# Patient Record
Sex: Female | Born: 1985 | Race: Black or African American
Health system: Northeastern US, Academic
[De-identification: ages and names within clinical notes are randomized; demographics above are authoritative.]

## PROBLEM LIST (undated history)

## (undated) DIAGNOSIS — G43909 Migraine, unspecified, not intractable, without status migrainosus: Secondary | ICD-10-CM

## (undated) DIAGNOSIS — D649 Anemia, unspecified: Secondary | ICD-10-CM

## (undated) DIAGNOSIS — M419 Scoliosis, unspecified: Secondary | ICD-10-CM

## (undated) DIAGNOSIS — E559 Vitamin D deficiency, unspecified: Secondary | ICD-10-CM

## (undated) DIAGNOSIS — IMO0002 Reserved for concepts with insufficient information to code with codable children: Secondary | ICD-10-CM

## (undated) HISTORY — DX: Anemia, unspecified: D64.9

## (undated) HISTORY — DX: Reserved for concepts with insufficient information to code with codable children: IMO0002

## (undated) HISTORY — DX: Vitamin D deficiency, unspecified: E55.9

## (undated) HISTORY — DX: Migraine, unspecified, not intractable, without status migrainosus: G43.909

## (undated) HISTORY — DX: Scoliosis, unspecified: M41.9

---

## 2012-03-01 IMAGING — US OB
1 series · 13 of 16 positions shown · non-contrast
Comparison: none

Final Report

Ultrasound first trimester.
Clinical statement: Pregnancy and spotting.
TECHNIQUE: Transabdominal and transvaginal imaging of the pelvis were 
performed.
No prior ultrasound is submitted for comparison.

[Series 1: ob · 0.22mm/px · 13 of 41 slices shown]
[im 1/41]
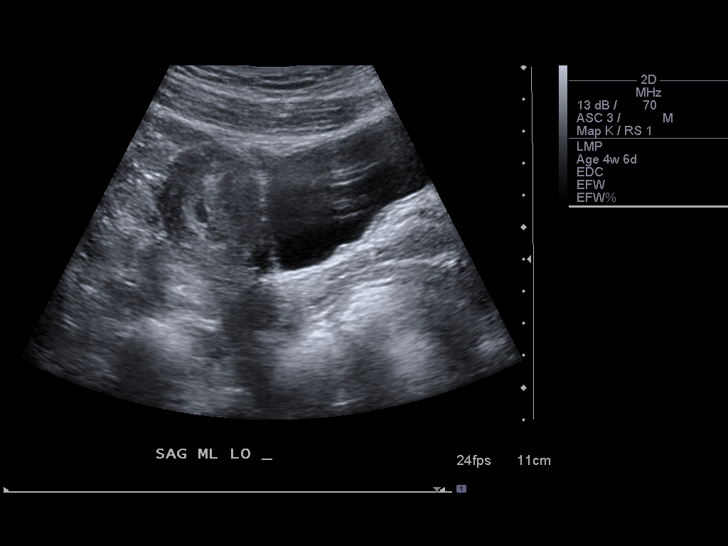
[im 3/41]
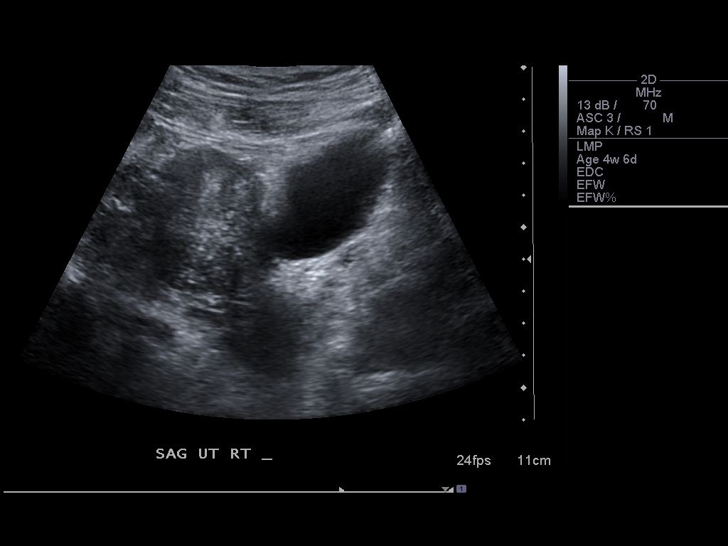
[im 9/41]
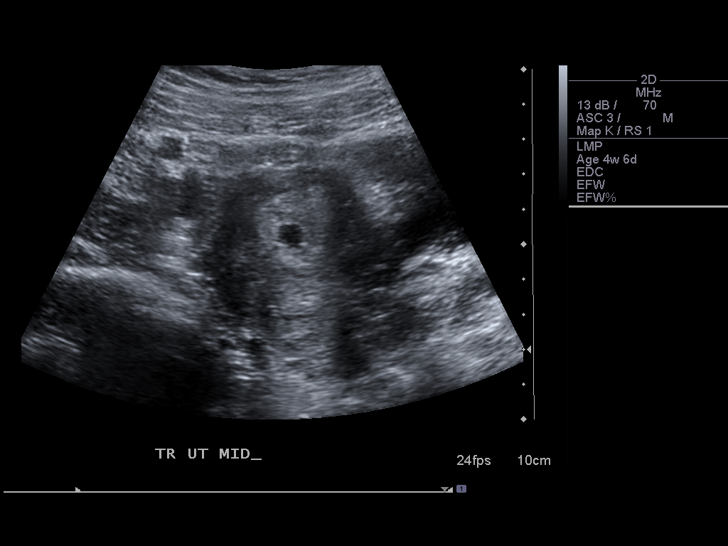
[im 11/41]
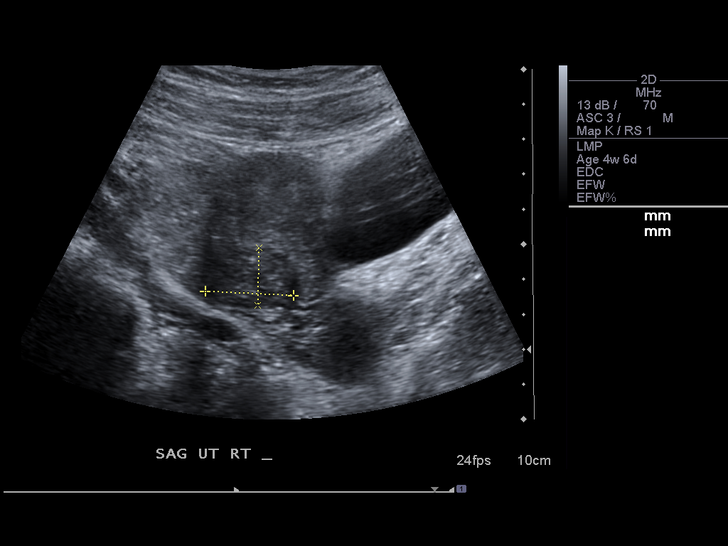
[im 14/41]
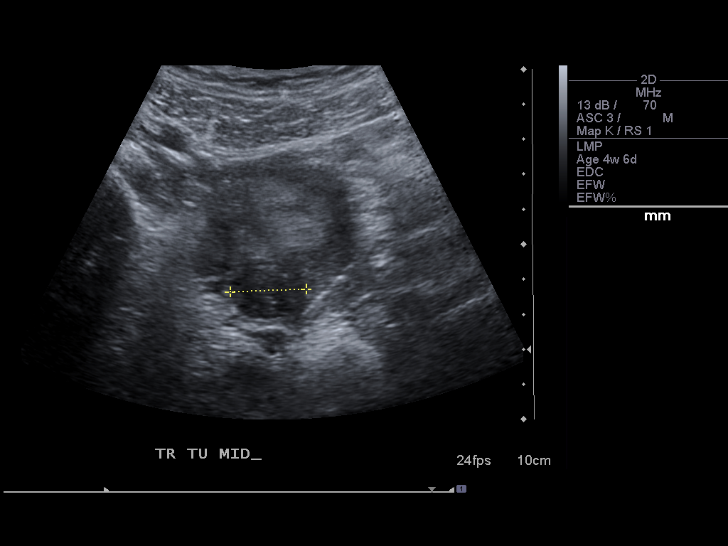
[im 17/41]
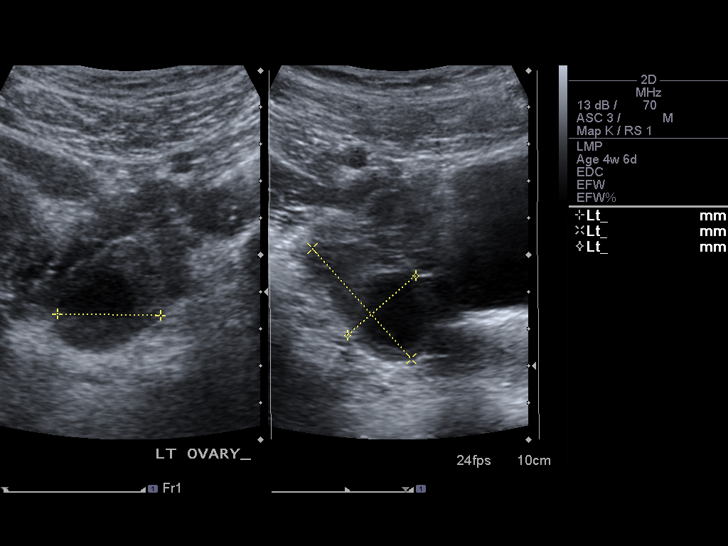
[im 22/41]
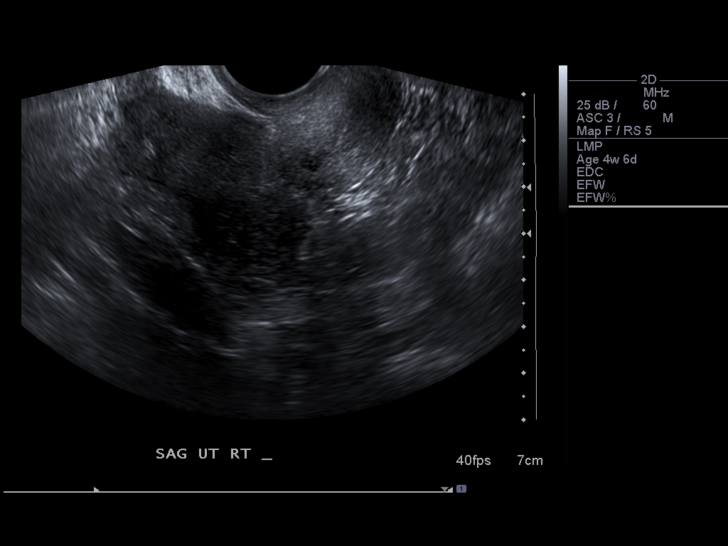
[im 25/41]
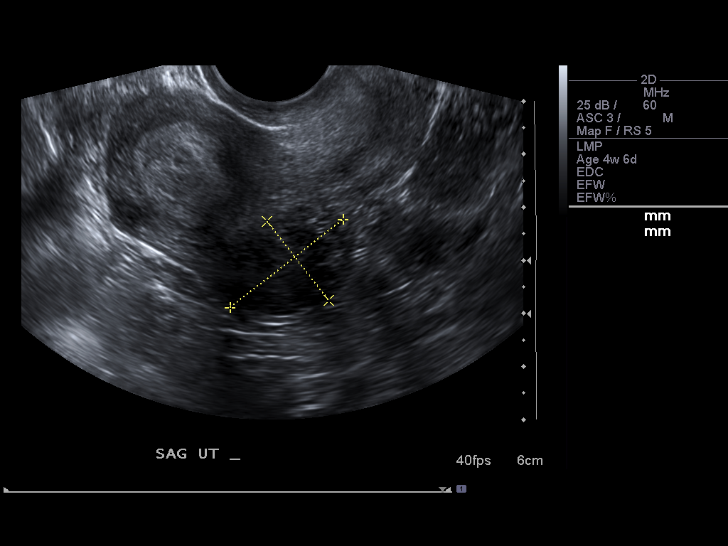
[im 27/41]
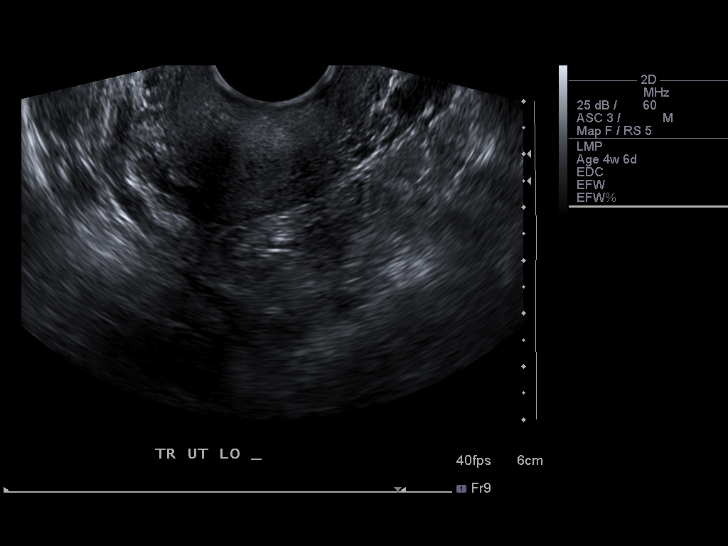
[im 30/41]
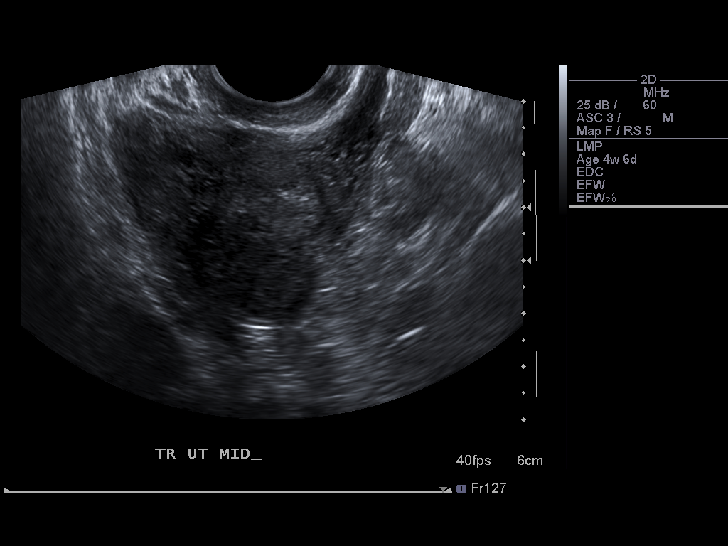
[im 33/41]
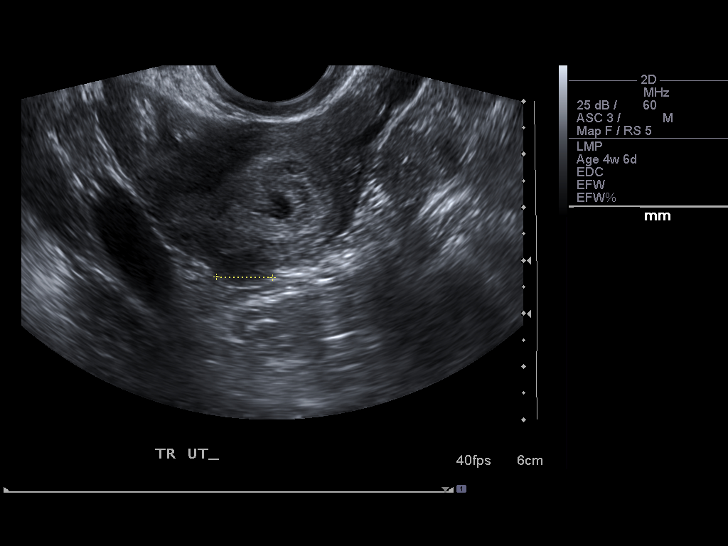
[im 38/41]
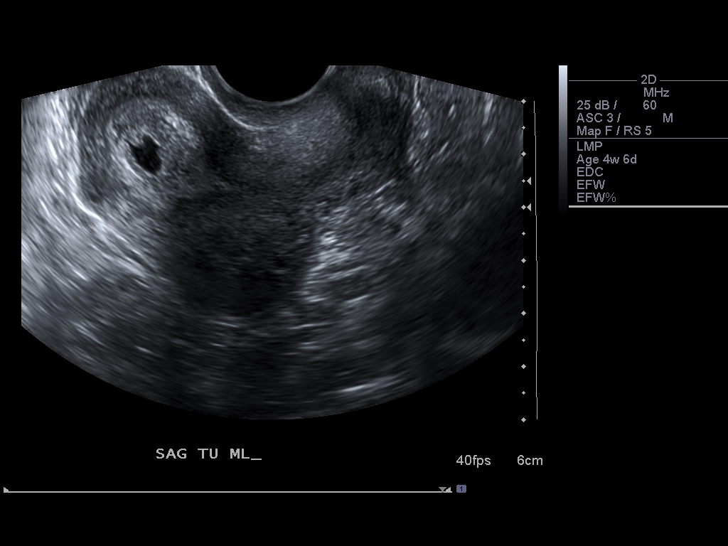
[im 41/41]
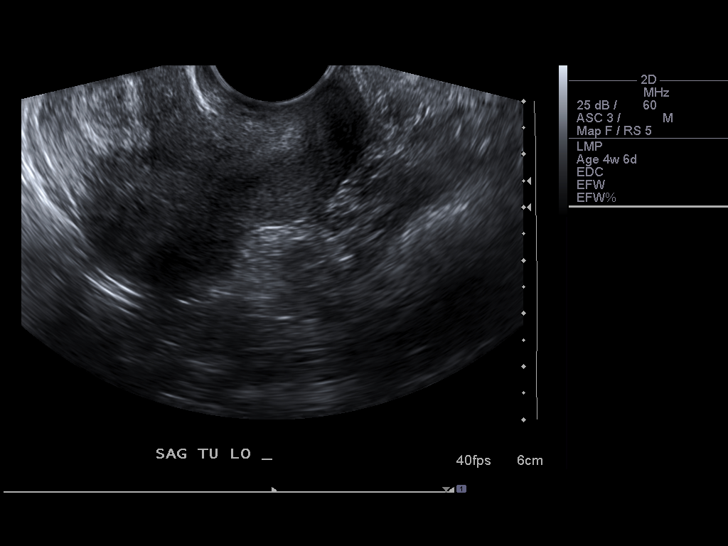

[13 of 16 positions shown; findings below may reference images not displayed]

FINDINGS: Uterus measures approximately 6.3 x 3.0 x 4.3 cm.  

Previously visualized small posterior uterine body fibroid noted 

measuring 2.7 x 2.7 x 1.1 cm.  There is a new large exophytic 

posterior mid uterine body fibroid measuring 2.7 x 1.9 x 2.4 cm.

A small saclike collection is noted in the endometrium with 

suggestion of decidual reaction likely representing an early 

intrauterine gestational sac measuring 0.6 x 0.6 x 0.7 cm with a mean 

sac diameter of 0.6 cm, corresponding to 5 weeks and 2 days.  No 

evidence of fetal pole or yolk sac is identified.

Left ovary measures 2.3 x 2.3 x 2.6 cm.  Left ovarian corpus luteal 

cyst measures 2.4 x 1.4 x 1.6 cm.  Right ovary measures 2.8 x 1.6 x 

1.8 cm.  Bilateral ovarian color Doppler flow are noted.
IMPRESSION: 1. Small sac like collection in the endometrium with suggestion of 

decidual reaction likely representing early intrauterine gestational 

sac.  No evidence of fetal pole or yolk sac.  Estimated gestational 

age based on mean sac diameter corresponds to 5 weeks and 2 days.  

Please correlate with current beta-hCG value.  Followup ultrasound 

and serial beta hCG value is recommended to document for progression.

2.  Left ovarian corpus luteal cyst.

3.  Large posterior exophytic mid uterine body fibroid, new since 

previous ultrasound.  Second small posterior fundal fibroid has not 

significantly changed.  Recommend gynecologic followup.

## 2012-03-20 ENCOUNTER — Telehealth: Payer: Self-pay

## 2012-03-20 NOTE — Telephone Encounter (Signed)
Left VM message to return call to [facility].

## 2012-03-20 NOTE — Telephone Encounter (Signed)
Patient called to start prenatal care.  She is new to the area.  NPI made for 03/23/12.  Pregnancy was confirmed at an ED in [place].  She is having brown spotting, has been off and on for a few weeks.

## 2012-03-21 NOTE — Telephone Encounter (Signed)
Spoke with patient- states " I'm all set- no problems"

## 2012-03-23 ENCOUNTER — Ambulatory Visit: Payer: Self-pay | Admitting: Reproductive Endocrinology and Infertility

## 2012-03-23 ENCOUNTER — Ambulatory Visit: Payer: Self-pay

## 2012-03-23 ENCOUNTER — Other Ambulatory Visit: Payer: Self-pay | Admitting: Reproductive Endocrinology and Infertility

## 2012-03-23 ENCOUNTER — Encounter: Payer: Self-pay | Admitting: Reproductive Endocrinology and Infertility

## 2012-03-23 VITALS — BP 130/68 | Ht 67.0 in | Wt 194.0 lb

## 2012-03-23 DIAGNOSIS — Z3480 Encounter for supervision of other normal pregnancy, unspecified trimester: Secondary | ICD-10-CM

## 2012-03-23 DIAGNOSIS — O4690 Antepartum hemorrhage, unspecified, unspecified trimester: Secondary | ICD-10-CM | POA: Insufficient documentation

## 2012-03-23 DIAGNOSIS — Z3400 Encounter for supervision of normal first pregnancy, unspecified trimester: Secondary | ICD-10-CM | POA: Insufficient documentation

## 2012-03-23 LAB — POCT URINE PREGNANCY
Lot #: 708616
Preg Test,UR POC: POSITIVE — AB

## 2012-03-23 LAB — TYPE AND SCREEN FOR PNP
ABO RH Blood Type: O POS
Antibody Screen: NEGATIVE

## 2012-03-23 MED ORDER — PRENATAL VITAMINS PLUS 27-1 MG PO TABS
1.0000 | ORAL_TABLET | Freq: Every day | ORAL | Status: DC
Start: 2012-03-23 — End: 2012-03-23

## 2012-03-23 MED ORDER — PRENATAL VITAMINS PLUS 27-1 MG PO TABS
1.0000 | ORAL_TABLET | Freq: Every day | ORAL | Status: DC
Start: 2012-03-23 — End: 2013-02-05

## 2012-03-23 NOTE — Progress Notes (Addendum)
Patient ID: Antanisha Berrien is a 26 y.o. female    HPI: Patient seen in the office today for NPI; recently moved to [place] from [place].  Patient's last menstrual period was 01/27/2012.  Patient presents to the office with complaints of vaginal spotting during pregnancy.  Patient reports spotting started about 8 weeks ago when she discovered she was pregnant.  Patient states spotting is brown in color, occurring every 2-3 days.  Patient reports occasional cramping.  Denies new sexual partners or concerns for STDs.  Denies current vaginal bleeding.    Patient's medications, allergies, past medical, surgical, social and family histories were reviewed and updated as appropriate.    Review of Systems    CONSTITUTIONAL: Appetite good, no fevers, night sweats or weight loss  CV: No chest pain, shortness of breath or peripheral edema  RESPIRATORY: No cough, wheezing or dyspnea  GI: No nausea/vomiting, abdominal pain, or change in bowel habits  GU: +Vaginal spotting.  No dysuria, urgency or incontinence  PSYCH: No depression or anxiety    Objective  BP 130/68    Physical Exam  Gen: 26 y.o. female, NAD  Abdominal: Soft, non-tender, no palpable masses, no rebound or guarding  PELVIC: Normal external female genitalia. BUS WNL.   VAGINA: pink, clear of discharge. No heme appreciated    CERVIX: without obvious lesions, no discharge, closed   UTERUS:  NSSC and non-tender.    ADNEXA: non-tender, negative for masses.        Assessment:    26 y.o. female with reported vaginal spotting in early pregnancy.         Plan:    No evidence of VB on exam today.  Pregnancy warning symptoms and ectopic precautions were reviewed.  USN for viability ordered.   Prenatal labs ordered.  Cervical DNA amplification for GC and Chlamydia and a vaginitis screen were collected.  Will follow up with patient for any abnormal results.   Bleeding precautions were reviewed.  RTO in 2 weeks for NOB or PRN

## 2012-03-23 NOTE — Patient Instructions (Signed)
Common Discomforts:  FIRST TRIMESTER    Pregnancy is an exciting time in your life.  Even though you may have planned to have this baby, your body's changes may not be something you planned on.  Here are some of the more common discomforts that women have during pregnancy.  Talk to your health care provider (HCP) if these tips do not seem to help or if you have other concerns.    Nausea and Morning Sickness  Nausea is the most common complaint of early pregnancy.  It is usually caused by hormonal changes.  Things that help with nausea include the following:  " Do not let your stomach get completely empty.  Eat small, frequent meals.   " Keep crackers or dry toast at your bedside and eat before getting up.  " Eat a small snack before going to bed at night.  " Do not drink large amounts of liquids at one time.  " Avoid foods that are greasy, spicy or have odors that bother you.  " Take your prenatal vitamin at night if taking it in the morning upsets your stomach.    Fatigue  Feeling tired is common in early pregnancy due to the increase needs of your body.  Your amount of sleep needs to increase by several hours.   It is important to take naps and go to bed earlier.  Even just putting your feet up for a few minutes every hour or so helps.    Heartburn  Heartburn is a burning feeling in your chest.  During early pregnancy, stomach acid can back up into your esophagus due to a change in your hormone levels.  Things you can do to help relieve heartburn are:  " Avoid eating large meals and greasy, spicy, fatty foods.  " Avoid chewing gum.  " Do not lie down, bend over or stoop for 2 hours after eating.   " Raise the head of your bed 6 inches. You can use a couple of blocks of wood to do this.   " Avoid tight clothing around your abdomen or waist.  " Check with your HCP about an antacid that is safe to use.     Constipation  Constipation (going too long without having a bowel movement) happens because your hormone levels  slow down the normal activity of your gastrointestinal (GI) tract, especially your intestines.  Things that help include:  " Increasing the "roughage" or fiber that you eat.  Eat more fruit, vegetables, dried fruits and bran or whole grain foods.  " Drinking more fluids-at least eight tall glasses of liquids each day.  Water is the best, but any liquids will help.  " Increasing your activity. Even taking an extra 15 to 30 minute walk each day can help.  " Maintaining regular bowel habits.  Try to have a bowel movement at the same time every day, but do not strain (push really hard) to get it out.  Sometimes a hot drink helps.  " Checking with your HCP about using a stool softener:  these medications make your bowel movement easier to get out.   Do no use laxative or enemas unless your HCP has ordered a special kind for you,    Frequent Urination  During early pregnancy you will notice that you have to go to the bathroom to urinate more often.  This is due to hormones in your body and to the growing uterus pushing on your bladder.  This frequency is   normal but tell your HCP if you notice any burning or pain when you urinate.     Breast Tenderness  Your breasts get larger during pregnancy as the milk glands develop.  Sometime you feel a tingling or throbbing sensation.  These changes are normal.   If you wear a bra, make sure it supports you without being too tight.     Vaginal Discharge  The change in your hormone levels makes a change in the cells in your vagina.   This causes an increase in vaginal secretions.  The discharge  is usually thin and clear or white in color.  It should not have any odor.  Things you can do to help yourself feel more comfortable include the following:  " Keep your vulva (private area) clean and dry.  " Do not wear tight clothing or too many layers of clothing.   " Wear cotton underwear  " Do not douche or use feminine hygiene products or strong soaps.  " Report irritation, odor or a  colored discharge to your HCP.    Warning Signs  Most women do not have problems during their pregnancy; however, sometimes a problem can occur.  Be sure to call your health care provider (HCP) right away so that any possible treatment can be started.  Warning signs in the first trimester include:  " Leakage of fluid from the vagina  " Bleeding from the vagina, with or without passing any blood clots or tissue.  " Fever of more than 100.4 degrees F or 38.0 degrees C  " Abdominal or pelvic (area between your hips) pain   " Severe back pain that does not go away with rest  " Nausea and vomiting that lasts more than 1 day.  " Tea-colored urine or not urinating often enough  " Severe headache  " Fainting or blackouts    If you have any of these symptoms, call our office right away.     Reviewed 04/2010      Food Safety in Pregnancy    What you need to know:  Not all foods are safe for pregnant women.  Some contain high levels of chemicals that can affect your baby's development.  Others put you at risk for getting an infection that can hurt your baby.    What you can do:  Use common sense when preparing and selecting food.  Avoid the following:  " Swordfish, shark, king mackerel and tile fish.  These fish can contain potentially risky levels of mercury.  Mercury can be transferred to the growing fetus and cause serious health problems.  Also avoid game fish until you check its safety with your local health department.  (A game fish is any fish caught for sport, such as a trout, salmon or bass).  " Raw fish, especially shellfish (oysters or clams)  " Undercooked meat, poultry and seafood.  Cook all of them thoroughly to kill bacteria.  Do not eat hotdogs or luncheon meats.  Examples are deli meats such as ham, turkey salami and bologna.  If you do eat these foods reheat them until steaming hot.   " Refrigerated pates or meat spreads.   Canned versions are safe.   " Refrigerated smoked seafood unless it has been cooked  (as in a casserole).  Canned versions are safe.  " Soft-scrambled and "over-easy" eggs and all foods made with raw or lightly cooked eggs.  " Soft cheeses made with unpasteurized milk.  Examples are Brie,   feta, Camembert, Roquefort, blue-veined, queso blanco, queso fesco and panela.  Check the label to see what kind of milk was used to make the cheese.    " Unpasteurized milk and any foods made from it.  " Unpasteurized juices  " Raw vegetable sprouts, including alfalfa, clover, radish and mung bean  " Herbal supplements and teas  Also do not eat too much liver.  It contains high amounts of Vitamin A, which can lead to birth defects.    Some studies indicate that your baby may be at increased risk of developing a food allergy in later life if you, your partner or a family member has a food allergy.  You may wish to consult a food allergy specialist for help in planning your diet during pregnancy and breastfeeding.     For more information, visit the web-site Food Safety for Moms-to-Be http://www.fda.gov/food/resourcesforyou/healtheducators/ucm081785.htm  Reviewed 04/2010      Approved Medication for the Pregnant Women:    Analgesia (Pain Relief )                    Tylenol  Flexiril    Antibiotics:  Amoxicillin  Penicillin  Macrobid  Erythromycin  Keflex    Asthma  Isoproterenol, Proventil, Beconase  Ventolin, Cromolyn    Cold Remedies  Sudafed (in 2nd and 3rd trimester use only)  Robitussin (guaifenesin) (?not 1st tri)  Vicks Vapor rub  Benadryl  Throat Lozenges (any brand)  Afrin Nasal Spray (3 day limit)    Allergies:  Zyrtec  Loratadine    Constipation:  Milk of Magnesia  Metamucil  Colace (docusate sodium)  Fiber laxatives    Diarrhea:  Kaopectate  Imodium (not 1st tri)   Hemorrhoids  Proctofoam  Preparation H  Anusol  Tucks    Indigestion  Tums  Maalox  Rolaids  Mylanta  Riopan plus simethicone  Pepcid  Zantac  Tagamet    Miscellaneous  Sulfacetamide eye drops   Hydrocortizone cream 0.5  %  Novocaine-lidocaine  (with epinephrine, if necessary)    Nausea & Vomiting  Emetrol  Gatorade  Reglan  Compazine  Phenergan  Zofran ODT  Dramamine  Meclizine  Vitamin B6, 25 mg. tablets: ½ tablet 3 times a day.  Peppermint and ginger tea  Peppermint    UTI:  Keflex  Erythromycin  Macrobid  Augmentin

## 2012-03-23 NOTE — Progress Notes (Signed)
Barriers to education/learning assessment    Person assessed: patient    Factors that affect learning:   Physical: wears glasses  Emotional: None  Misc:None    Patient has support system for learning:yes  spouse / significant other    Ability/readiness to learn (ability to grasp concepts, respond to questions, follow directions)  Comprehension: Good   Motivation: Ask questions and Eager to learn  Preferred learning method: Discussion, Hearing and Visualization      Educational background  Some College    Assessment by: Danford Bad    Nutritional Risk Assessment      Obesity (>30 BMI) There is no height or weight on file to calculate BMI.  Body mass index is 30.38 kg/(m^2).       Patient requests nutritional consultation no          Functional Screen:    Any difficulties with: getting out of a chair, climbing stairs, walking, bathing, dressing, toileting, cooking, feeding yourself, work, other daily activities: no (if yes, specify details)    Infection History:  - History of STD's: no  - History of Chicken pox: yes  - Has patient be vaccinated: N/A  - Does the patient own a cat? no  - if yes, was the patient educated? yes  - History of TB or positive PPD? no    Social History:  - Do you have any history of domestic violence? no  - Do you feel unsafe with your partner? no  - Do you have any issues with transportation, food, housing, financial assistance, childcare, clothing, baby supplies? Insurance needs  - Do you feel you need to see social work? no        Helen Morrison is a 26 y.o. G2P0010. LMP was 01/27/2012 She is at [redacted]w[redacted]d   gestation. Patient reports breast tenderness and vaginal spotting.    Social History:  Patient is currently employed as a Engineering geologist. Patient has a stable home environment. Father of the baby is is involved with the pregnancy.Domestic violence:No.  Social work referral was not made.    Medical / Surgical history:   Past Medical History   Diagnosis Date   . Abnormal Pap smear     . Anemia    . Migraines      Allergies as of 03/23/2012 - Up to Date 03/23/2012   Allergen Reaction Noted   . Pcn (penicillins) Swelling 03/23/2012       Ob /Gyn History: The patient has an STD history of none and her HIV status is unknown.    OB History    Grav Para Term Preterm Abortions TAB SAB Ect Mult Living    2    1  1                Plan:  New OB appointment was scheduled. Referrals made to [facility] and financial aid. The patient consented to testing for HIV testing and CDS. The patient declined testing for nothing. The patient was sent for no tests. First trimester teaching was done. The patient verbally indicated she understands the teaching and plan.  New pt,just moved here from [facility].States was seen in an ED in [place] twice in August for c/o vaginal spotting,pt unable to locate records to bring today.Medical release has been signed,will be given to JKromm.Pt reports last episode of spotting was on 03/14/2012,seen at an Urgent Care in [place] on 09.03.13 for this complaint States has had BHCG levels done, U/S in [place] that was "too early".Pt is  unsure of her blood type.No pain today.  Urgent appt has been scheduled for this afternoon with [facility],as a f/u on vaginal spotting and indeterminate U/S.  Pt also has met with the [facility] today.    HIV pretest counseling for less than 8 minutes.    Helen Morrison 03/23/2012

## 2012-03-24 ENCOUNTER — Telehealth: Payer: Self-pay

## 2012-03-24 LAB — HIV 1&2 ANTIGEN/ANTIBODY: HIV 1&2 ANTIGEN/ANTIBODY: NONREACTIVE

## 2012-03-24 LAB — CHLAMYDIA PLASMID DNA AMPLIFICATION: Chlamydia Plasmid DNA Amplification: 0

## 2012-03-24 LAB — VAGINITIS SCREEN: DNA PROBE: Vaginitis Screen:DNA Probe: 0

## 2012-03-24 LAB — N. GONORRHOEAE DNA AMPLIFICATION: N. gonorrhoeae DNA Amplification: 0

## 2012-03-24 NOTE — Telephone Encounter (Signed)
Obc/patient is calling to schedule a physical for work/school,can be reached at [phone]

## 2012-03-24 NOTE — Telephone Encounter (Signed)
Spoke to Dr. Geradine Girt in regards to policies for work/school physicals.  We are able to perform them as long as they do not require hearing/eye tests and we are not "equipped" to perform those tests.  Informed pt, stated that she did not see noted on the form the need for eye/hearing tests to be performed.  Pt stated the form to be more immunization driven.  Informed pt to bring form with her and present at time of NOB, pt voiced understanding.  No further questions or concerns.

## 2012-03-27 LAB — PRENATAL PROFILE
Baso # K/uL: 0 10*3/uL (ref 0.0–0.1)
Basophil %: 0.2 % (ref 0.1–1.2)
Eos # K/uL: 0.1 10*3/uL (ref 0.0–0.4)
Eosinophil %: 1.4 % (ref 0.7–5.8)
HBV S Ag: NEGATIVE
Hematocrit: 36 % (ref 34–45)
Hemoglobin: 11.6 g/dL (ref 11.2–15.7)
Lymph # K/uL: 1.9 10*3/uL (ref 1.2–3.7)
Lymphocyte %: 31.2 % (ref 19.3–51.7)
MCV: 86 fL (ref 79–95)
Mono # K/uL: 0.7 10*3/uL (ref 0.2–0.9)
Monocyte %: 10.6 % (ref 4.7–12.5)
Neut # K/uL: 3.5 10*3/uL (ref 1.6–6.1)
Platelets: 249 10*3/uL (ref 160–370)
RBC: 4.2 MIL/uL (ref 3.9–5.2)
RDW: 13.3 % (ref 11.7–14.4)
Rubella IgG AB: POSITIVE
Seg Neut %: 56.6 % (ref 34.0–71.1)
Syphilis Screen: NEGATIVE
Syphilis Status: NONREACTIVE
WBC: 6.2 10*3/uL (ref 4.0–10.0)

## 2012-04-04 ENCOUNTER — Other Ambulatory Visit: Payer: Self-pay | Admitting: Gastroenterology

## 2012-04-04 ENCOUNTER — Ambulatory Visit: Payer: Self-pay

## 2012-04-13 ENCOUNTER — Ambulatory Visit: Payer: Self-pay | Admitting: Reproductive Endocrinology and Infertility

## 2012-04-13 ENCOUNTER — Encounter: Payer: Self-pay | Admitting: Reproductive Endocrinology and Infertility

## 2012-04-13 VITALS — BP 126/59 | Ht 67.0 in | Wt 192.6 lb

## 2012-04-13 DIAGNOSIS — O4690 Antepartum hemorrhage, unspecified, unspecified trimester: Secondary | ICD-10-CM

## 2012-04-13 DIAGNOSIS — Z3400 Encounter for supervision of normal first pregnancy, unspecified trimester: Secondary | ICD-10-CM

## 2012-04-13 DIAGNOSIS — Z8669 Personal history of other diseases of the nervous system and sense organs: Secondary | ICD-10-CM

## 2012-04-13 LAB — POCT URINALYSIS DIPSTICK
Bilirubin,Ur: NEGATIVE
Blood,UA POCT: NEGATIVE
Glucose,UA POCT: NORMAL
Leuk Esterase,UA POCT: NEGATIVE
Lot #: 22041202
Nitrite,UA POCT: NEGATIVE
PH,UA POCT: 6 (ref 5–8)
Specific gravity,UA POCT: 1.015 (ref 1.002–1.03)
Urobilinogen,UA: NORMAL

## 2012-04-13 LAB — DRUG SCREEN CHEMICAL DEPENDENCY, URINE
Amphetamine,UR: NEGATIVE
Benzodiazepinen,UR: NEGATIVE
Cocaine/Metab,UR: NEGATIVE
Opiates,UR: NEGATIVE
THC Metabolite,UR: NEGATIVE

## 2012-04-13 NOTE — Patient Instructions (Signed)
wOCommon Discomforts:  SECOND TRIMESTER    You have made it through the first third of your pregnancy! The middle 3 months--the second trimester--are usually the easiest. Although some women still have nausea and feel especially tired for their whole pregnancy, most women are feeling much better.   Your growing baby and uterus can cause some discomforts, though.  Here are 4 common ones and ways to deal with them:    Back Pain  Lower back pain can be caused by muscle strain (from stretching, bending or lifting).   Sometimes your hormones change and leave you more at risk for back pain. Weak muscles also strain and hurt more easily.  Here are some things that can lesson you discomfort:  • Use proper body mechanics by lifting with your back straight and your knees bent.  Do not twist while lifting or pulling.  Stand and sit with your back as straight as possible.   • Avoid standing too long in one spot. If you can, rest one foot on a footstool or step while you are standing.  Change which foot you lean on every few minutes.   • Wear a special support girdle for pregnant women.   You can find these at maternity shops or a medical supply store.   • Use a footstool when you have to sit for long periods, so your legs are no dangling.  • Ask your partner or a friend for a back rub, or use a heating pad on low setting to relieve aches. Sometimes a warm, not hot, bath feels good too.  • Try to sleep on a firm, supportive mattress. Put a board under your mattress if it is too soft.   Use lost of pillows!  Wedge one under your belly when you are on your side and use one between your knees while on your side too.  Avoid lying flat on your back.  Lean into a pillow placed under your side instead.  • Do pelvic tilt exercises.  On your hands and knees like an angry cat.  Rock forward and backward a little to stretch out your back.   On your back, bend your knees, then lift your bottom up in the air so only your head, shoulders  and feet touch the floor.  Hold this position for a few seconds, then relax and do it again.   • Wear good walking shoes as much as possible.  Try not to wear shoes with a heel higher than 2 inches.  If your backache “comes and goes” in a regular pattern or if you have leakage or bleeding from your vagina, call your health care provider right away.    Hemorrhoids  Hemorrhoids are also called piles.  They are extra-large blood vessels in your rectum (where you have bowel movements) that itch and sometimes bleed.  They are caused    by the increased pressure of your baby in your pelvis (area between your hips).  The pressure of the baby blocks off the blood vessels and causes them to get too big.  Things that help keep hemorrhoids from happening to getting too bad include the following:  • Avoid constipation.  Eat lots of fruits and vegetables so you have a bowel movement every day if possible.  Ask your health care provider if you need a laxative to help you have regular bowel movements.  • Do not strain or push when having a bowel movement.  Breathe slowly, relax and let   it come out without pushing.  • Drink more fluids and eat foods high in fiber, like whole grains.  • Do not have anal sex (in which the man puts his penis in your anus, where bowel movements come out).  • Take stiz baths with cool water 2-3 times a day.  A sitz bath is a little tub that fits on your toilet and lets you soak your bottom without getting the rest of your body wet. You can buy a stiz bath kit at a drugstore or medical supply store.   • Use ice packs or witch hazel pads (like Tucks) to take the swelling down.   • Do Kegel exercises.  A Kegel exercise is when you tighten the muscles in your bottom. Next time you urinate, try to stop the urine.  The muscle tightening you do to stop your urine is a Kegel exercise.  After you figure out how, only do Kegel exercises when you are not urinating. Starting and stopping your urine flow too much  can lead to infections or bladder problems.  • Try to sit with your legs crossed tailor style (both feet tucked under the opposite leg, with the knees sticking out) or sit on hard chairs.  Do not sit too long in any chair.  • I you notice bleeding when you go to the bathroom, talk with your health care provider.     Leg Cramps  Leg cramps happen in pregnancy, but it is unclear why.  Things that can help prevent or lessen them include the following:  • Get more calcium in your diet.  Se the guide on Nutrition during Pregnancy for some ways of getting calcium in your diet. In addition to dairy products, there is calcium-fortified orange juice.  Ask your health care provider if you can take a calcium supplement (like Tums).   • Exercise.  Even walking 30 minutes a day can help.  And you can walk for 10 minutes 3 times a day for the same benefit.   • If you get a cramp, try straightening your leg.  Sometimes pointing your toe toward your knee helps.  Stand a foot or so from a wall and lean toward it keeping your knees straight and your feet flat on the floor.  • Massage or apply mild heat with a warm wash cloth or heating pad on low to the cramping area.  If you feel a hard knot or hot spot on your leg, especially in the calf (back of the lower leg that does not go away, call you health care provider right away.    Varicose Veins  Varicose veins show up as blue line under the skin usually on your lower legs.  Sometimes they are thick like cords.  They can be due to your hormones, to pressure on your pelvis from the baby, or to our family tendency (someone else in your family had them).  Things you can do to help present varicose veins includes the following:  • Avoid constrictive (tight) clothing, especially if it is tight in your waist or hip areas.  Also avoid knee socks or hose with elastic bands at the top.  • Try not to sit or stand for long periods without moving around.   • Use good posture and good body  mechanics.  (See the section of this guide on back pain).    If you get varicose veins, these things can help keep them from bothering you or getting   worse:  • Wear good support hose.  You can buy support hose at the maternity shops or medical supplies stores.   • Raise your legs on a footstool when sitting.  Lie down and put your feet up higher than your head every chance you get.  • Talk with your HCP about other things to keep you comfortable.     Many women never have any of these discomforts during their pregnancy.  Others have a lot of discomforts.  Be sure to tell your Health Care Provider about anything that you find uncomfortable.  Write down your questions as they come up so you will remember to ask them during your next office visit or phone call.      Warning Signs: SECOND TRIMESTER    No one wants something to go wrong during her pregnancy.  It can be really temping to not tell anyone when something happens; you just hope it will go away on its own.  Usually something that worries you is not a big problem but it is important to talk to your health care provider any time you have a worry.  Most of the time, bigger problems can be prevented if you get help early enough.  During your second trimester, there are three warning signs that you should call your Health Care Provider about:    Bleeding  After your twentieth week or fifth month of pregnancy, bleeding can be due to several causes.  There can be problems with the placenta.  Placenta previa is when the placenta forms across the cervix (opening to the uterus): placenta abruption is when the placenta pulls ways from the inside of the uterus.  Sometimes sores or scratches on your cervix or vagina can cause bleeding.  Vaginal infections or polyps (see the guide on Sexually Transmitted Diseases) can cause bleeding.  Preterm labor may also cause bleeding.      You may or may not have pain with vaginal bleeding.  It is very important to call your Health  Care Provider or go to the hospital to be examined any time you have vaginal bleeding during your pregnancy.    Pain  Abdominal or pelvic (between your hips) pain during your pregnancy needs to be checked out by your Health Care Provider.       Non-pregnancy causes of pain need to be considered along with pregnancy causes.  Back pain that comes and goes in a pattern or does not go away at all need to be checked out.  Call your Health Care Provider or if the pain is really bad or sudden, go to the hospital where you plan to deliver so you can be examined.     Infection and Fever  When you are pregnant, you can have “normal” colds and infections.  But it is important to tell your Health Care Provider if you have:  • A fever of more than 100.4 degrees F or 38.0 C  • A burning sensation or pain when you urinate or a need to go to the bathroom often  • Green or yellow drainage from your nose, or a tendency to cough up green or yellow phlegm.  • Toothaches, bleeding gums, or sores in the mouth that do not go away.  • Vomiting or diarrhea that last more than an hour or so.  • Any symptom that worries you    If you have a fever, you need to increase the amount of fluids you drink. Fluids help   you flush infection out of your body.  They also help keep you from getting dehydrated (dried out), which is bad for you and the baby. If you have an infection it is also best to get more rest so that your body has the energy to fight the germs.   Antibiotics (medicines that treat infections) are safe during pregnancy and may be necessary to protect your baby from any possible harm from your infection.  Your Health Care Provider will order medicine if you need it.     Remember--you and your Health Care Provider are a team, working to help you have a safe pregnancy and a healthy baby.  Be sure to let your Health Care Provider know of anything that concerns you.  Ignoring or worrying about a problem won’t help it to go away! Getting help  right away can make a big difference in your health and your baby’s safety.

## 2012-04-13 NOTE — Addendum Note (Signed)
Addended by: Jerry Caras on: 04/13/2012 11:15 AM     Modules accepted: Orders

## 2012-04-13 NOTE — Progress Notes (Signed)
Helen Morrison is a 26 y.o. female who presents for New OB physical    HPI:   The patient is a 26 y.o. year old gravida 2, para 0 female at [redacted]w[redacted]d gestation by Ultrasound dating at 8 weeks.  Today she is  doing well.  Denies any complaints of vaginal bleeding or pelvic pain.      OB/GYN History   OB History    Grav Para Term Preterm Abortions TAB SAB Ect Mult Living    2    1  1               GYN History:  Last pap smear NA; denies history of abnormal paps  STI History: none     Menstrual History: regular periods every 28-30 days    PSH: No past surgical history on file.    Family Hx:   Family History   Problem Relation Age of Onset   . Hypertension Mother    . Diabetes Maternal Aunt    . Diabetes Paternal Aunt      .  Personal Hx/Prenatal Risks:  O RH POS  History   Smoking status   . Never Smoker    Smokeless tobacco   . Never Used     ETOH: Denies  Drugs: Denies     ROS: A Comprehensive Review of Systems was negative   O:    Filed Vitals:    04/13/12 1018   BP: 126/59   Height: 5\' 7"  (1.702 m)   Weight: 192 lb 9.6 oz (87.363 kg)     Prenatal Physical Exam    General Exam  HEENT: Normal   Thyroid: Normal   Lymph Nodes: Normal   Neurological: Normal   Skin: Normal   Heart: Normal   Lungs: Normal   Breasts: Normal   Abdomen: Normal   Extremities: Normal     Pelvic  Vulva: Normal   Vagina: Normal   Cervix: Normal   Uterus 11 wks    Adnexa: Normal   Rectum: Normal   Spines: Average   Sacrum: Concave   Subpubic Arch: Normal       Diagonal conjucate    13 cm  Pelvic Type: Gynecoid       A:  26 y.o. G2 P0  female at [redacted]w[redacted]d gestation    P:  Pap smear collected.  Will follow up with patient for any abnormal results.  All prenatal labs were reviewed .  Consented for first trimester screen.  Pregnancy warning symptoms were reviewed.  She was advised to RTC in 4 weeks or on a PRN basis

## 2012-04-13 NOTE — Addendum Note (Signed)
Addended by: Zoila Shutter on: 04/13/2012 12:03 PM     Modules accepted: Orders

## 2012-04-14 LAB — AEROBIC CULTURE: Aerobic Culture: 0

## 2012-04-18 ENCOUNTER — Other Ambulatory Visit: Payer: Self-pay | Admitting: Reproductive Endocrinology and Infertility

## 2012-04-18 ENCOUNTER — Ambulatory Visit: Payer: Self-pay

## 2012-04-18 LAB — GYN CYTOLOGY

## 2012-04-18 NOTE — Progress Notes (Signed)
Patient presents for PPD placement.  Wt. 194lbs, BP 130/78   Allergic to penicillin  PPD placed right forearm  Patient will return in 48hours for PPD read

## 2012-04-20 ENCOUNTER — Ambulatory Visit: Payer: Self-pay

## 2012-04-20 NOTE — Progress Notes (Signed)
Patient here today for PPD read.  PPD was applied to right forearm on 04/18/12.  Result today was neg.  Documentation provided for patient.    VS:  Wt. 193lb.          BP  123/75

## 2012-04-27 ENCOUNTER — Ambulatory Visit: Payer: Self-pay

## 2012-04-27 ENCOUNTER — Other Ambulatory Visit: Payer: Self-pay | Admitting: Gastroenterology

## 2012-04-28 ENCOUNTER — Encounter: Payer: Self-pay | Admitting: Reproductive Endocrinology and Infertility

## 2012-05-09 ENCOUNTER — Encounter: Payer: Self-pay | Admitting: Reproductive Endocrinology and Infertility

## 2012-05-09 ENCOUNTER — Ambulatory Visit: Payer: Self-pay | Admitting: Reproductive Endocrinology and Infertility

## 2012-05-09 VITALS — BP 134/64 | Ht 67.0 in | Wt 190.0 lb

## 2012-05-09 DIAGNOSIS — Z3400 Encounter for supervision of normal first pregnancy, unspecified trimester: Secondary | ICD-10-CM

## 2012-05-09 DIAGNOSIS — N898 Other specified noninflammatory disorders of vagina: Secondary | ICD-10-CM

## 2012-05-09 DIAGNOSIS — B373 Candidiasis of vulva and vagina: Secondary | ICD-10-CM

## 2012-05-09 LAB — POCT VAGINAL WET MOUNT
CLUE CELLS,POC: NEGATIVE
KOH FOR FUNGUS: POSITIVE
TRICHOMONAS, POC: NEGATIVE
WHIFF TEST: NEGATIVE
YEAST,POC: POSITIVE

## 2012-05-09 LAB — POCT URINALYSIS DIPSTICK
Bilirubin,Ur: NEGATIVE
Blood,UA POCT: NEGATIVE
Glucose,UA POCT: NORMAL
Leuk Esterase,UA POCT: 1 — AB
Lot #: 22041202
Nitrite,UA POCT: NEGATIVE
PH,UA POCT: 7 (ref 5–8)
Protein,UA POCT: NEGATIVE mg/dL
Specific gravity,UA POCT: 1.02 (ref 1.002–1.03)
Urobilinogen,UA: NORMAL

## 2012-05-09 MED ORDER — TERCONAZOLE 0.4 % VA CREA *I*
1.0000 | TOPICAL_CREAM | Freq: Every evening | VAGINAL | Status: AC
Start: 2012-05-09 — End: 2012-05-16

## 2012-05-09 NOTE — Progress Notes (Signed)
Helen Morrison is a 26 y.o. female presents for routine Prenatal Visit at [redacted]w[redacted]d     Subjective:  Complains of vaginal irritation and itching for the past week.  Denies vaginal discharge, denies changes to soaps, detergents or hygiene products.  +FM, Denies VB or LOF.      OB History    Grav Para Term Preterm Abortions TAB SAB Ect Mult Living    2    1  1              ROS:   A comprehensive review of systems was negative except for: Genitourinary: positive for vaginal itching  Objective:    Filed Vitals:    05/09/12 1056   BP: 134/64   Height: 5\' 7"  (1.702 m)   Weight: 190 lb (86.183 kg)     Fundal Height: 14 cm  FHT: 157 bpm     Physical Exam  Gen: 26 y.o. female, NAD  PELVIC: Normal external female genitalia. BUS WNL.   VAGINA: erythematous, moderate amount of thick yellow discharge.    CERVIX: visibly closed    Recent Results (from the past 24 hour(s))   POCT URINALYSIS DIPSTICK    Collection Time    05/09/12 11:01 AM       Result Value Range    Specific gravity,UA POCT 1.020  1.002 - 1.03    PH,UA POCT 7.0  5 - 8    Leuk Esterase,UA POCT +1 (*) Negative    Nitrite,UA POCT Negative  Negative    Protein,UA POCT Negative  Negative mg/dL    Glucose,UA POCT Normal  Normal    Ketones,UA POCT ++ moderate (*) Negative    Urobilinogen,UA Normal      Bilirubin,Ur Negative  Negative    Blood,UA POCT Negative  Negative    Exp date 8/14      Lot # [id number]     POCT VAGINAL WET MOUNT    Collection Time    05/09/12 11:16 AM       Result Value Range    TRICHOMONAS, POC Negative-none seen      YEAST,POC Positive moderate      YEAST COMMENT,POC        CLUE CELLS,POC Negative-none seen      LEUKOCYTES,POC        KOH FOR FUNGUS Positive moderate      KOH FOR FUNGUS COMMENT        WHIFF TEST Negative         Assessment:   26 y.o. year old G2 P101 female at [redacted]w[redacted]d weeks gestation; vaginal yeast infection    Plan:    Patient Active Problem List    Diagnosis Date Noted   . Supervision of normal first pregnancy 03/23/2012     Priority:  High     Rh Positive/Rubella immune  -first trimester screen: Unable to obtain nuchal translucency due to fetal position. Recommend second trimester screening  <> QUAD screen  <>Anatomic scan  Refused flu vaccine     . History of migraine headaches 04/13/2012     Was followed by neurology in [place].  Has not establish Neuro care here in [place].  Reports has not had any migraine headaches since her pregnancy began     . Vaginal bleeding in pregnancy 03/23/2012     USN for viability: IUP at 9.5 weks on 10/1  -Type and screen: O+  No further complaints of VB in early pregnancy     Vaginal candidiais: She was given a prescription  for Terconazole 7; one applicator PV QHS x 7.   Pregnancy warning symptoms were reviewed.  RTC in 4 weeks or PRN.

## 2012-05-09 NOTE — Patient Instructions (Signed)
Terazol                                                     [facility]  Terconazole (ter-KON-a-zole) (Vaginal)  Treats vaginal yeast infections. Belongs to a class of drugs called antifungals.   Brand Name(s):Terazol 3 , Zazole , Terazol 7 , Terazol  There may be other brand names for this medicine.  When This Medicine Should Not Be Used:  You should not use this medicine if you have had an allergic reaction to antifungals, such as Gyne-Lotrimin, Monistat, Mycelex, Micatin, Spectazole, or Femstat.   How to Use This Medicine:  Cream, Suppository  Your doctor will tell you how much medicine to use and how often. Wash your hands before and after using this medicine.   This medicine is to be used only in the vagina. Use at bedtime unless your doctor tells you otherwise.   The vaginal cream comes in a tube. You will use an applicator to put the cream into your vagina.   The applicator is an empty plastic tube called a barrel. There is a plunger on one end and an opening on the other end.   Remove the cap from the end of the tube.   Screw the open end of the applicator onto the tube of cream. Pull the plunger out all the way and squeeze the tube until the applicator is full.   Unscrew the applicator from the tube and replace the cap on the tube.   If you are using the vaginal suppositories, unwrap the tablet and place it in the applicator with the pointed side up. You may wet the tablet with warm water or a water soluble lubricating gel (K-Y Jelly). You should not use petroleum jelly (Vaseline).   Gently push the applicator high into the vagina and push the plunger all the way in.   After using, pull the plunger completely out of the applicator and wash both pieces with warm, soapy water. Never use hot or boiling water.   Wear only clean cotton underwear (panties) instead of nylon or rayon underwear. The medicine will come out of your vagina, so wear a minipad or sanitary napkin to protect your  clothing. Keep using this medicine for as long as your doctor tells you to. Continue to use it even if your menstrual period begins. Use pads rather than tampons.  If a dose is missed:  Use your medicine as soon as you remember that you have missed your dose.   If it is nearly time for your next dose, wait until then to use the medicine and skip the missed dose.   You should not use two doses at one time.  How to Store and Dispose of This Medicine:  Keep your medicine at room temperature, away from direct heat or light. Do not freeze.   Keep all medicine away from children.  Drugs and Foods to Avoid:  Ask your doctor or pharmacist before using any other medicine, including over-the-counter medicines, vitamins, and herbal products.  Avoid using other medicines, douches, or vaginal creams unless recommended by your doctor.  Warnings While Using This Medicine:  If you are pregnant, talk to your doctor before using this medicine.   Do not stop using terconazole unless your doctor tells you to. If you stop using the   medicine too soon, your infection may return.   Call your doctor right away if your symptoms do not go away after using this medicine for the full time ordered by your doctor.  Possible Side Effects While Using This Medicine:  If you notice these less serious side effects, talk with your doctor:  Vaginal itching or burning   Increased vaginal discharge   Stomach discomfort   Cramps   Skin rash   Headache  If you notice other side effects that you think are caused by this medicine, tell your doctor.  Call your doctor for medical advice about side effects. You may report side effects to FDA at 1-800-FDA-1088

## 2012-05-09 NOTE — Addendum Note (Signed)
Addended by: Clydie Braun on: 05/09/2012 11:35 AM     Modules accepted: Orders

## 2012-05-10 ENCOUNTER — Encounter: Payer: Self-pay | Admitting: Reproductive Endocrinology and Infertility

## 2012-05-10 LAB — MATERNAL 2ND TRIMESTER QUAD SCR (14-22 6/7 WEEKS)
AFP MoM: 1.62
AFP: 33 IU/mL
Age at Delivery: 28.2 yrs.
DS A Priori Risk: 1:885 {titer}
DS Screen Risk: 1:640 {titer}
HCG MoM: 2.65
HCG: 91015 m[IU]/mL
Inhibin A MoM: 2.12
Inhibin A: 370 pg/mL
OSB Risk: 1:3960 {titer}
Patient Weight: 190 [lb_av]
T18 Screen Risk: <1:133000 {titer}
uE3 MoM: 0.95
uE3: 0.49 ng/mL

## 2012-05-11 ENCOUNTER — Encounter: Payer: Self-pay | Admitting: Reproductive Endocrinology and Infertility

## 2012-05-11 LAB — LEAD VENOUS: Lead,Venous: <1 ug/dl (ref 0–25)

## 2012-05-11 LAB — LEAD, BLOOD

## 2012-05-24 LAB — CYSTIC FIBROSIS MUTATION PANEL

## 2012-06-06 ENCOUNTER — Encounter: Payer: Self-pay | Admitting: Reproductive Endocrinology and Infertility

## 2012-06-06 ENCOUNTER — Ambulatory Visit: Payer: Self-pay | Admitting: Reproductive Endocrinology and Infertility

## 2012-06-06 VITALS — BP 118/62 | Ht 67.0 in | Wt 192.0 lb

## 2012-06-06 DIAGNOSIS — Z3400 Encounter for supervision of normal first pregnancy, unspecified trimester: Secondary | ICD-10-CM

## 2012-06-06 LAB — POCT URINALYSIS DIPSTICK
Bilirubin,Ur: NEGATIVE
Blood,UA POCT: NEGATIVE
Glucose,UA POCT: NORMAL
Ketones,UA POCT: NEGATIVE
Leuk Esterase,UA POCT: 1 — AB
Lot #: 22041202
Nitrite,UA POCT: NEGATIVE
PH,UA POCT: 7 (ref 5–8)
Protein,UA POCT: NEGATIVE mg/dL
Specific gravity,UA POCT: 1.015 (ref 1.002–1.03)
Urobilinogen,UA: NORMAL

## 2012-06-06 NOTE — Patient Instructions (Signed)
Common Discomforts:  SECOND TRIMESTER    You have made it through the first third of your pregnancy! The middle 3 months--the second trimester--are usually the easiest. Although some women still have nausea and feel especially tired for their whole pregnancy, most women are feeling much better.   Your growing baby and uterus can cause some discomforts, though.  Here are 4 common ones and ways to deal with them:    Back Pain  Lower back pain can be caused by muscle strain (from stretching, bending or lifting).   Sometimes your hormones change and leave you more at risk for back pain. Weak muscles also strain and hurt more easily.  Here are some things that can lesson you discomfort:  • Use proper body mechanics by lifting with your back straight and your knees bent.  Do not twist while lifting or pulling.  Stand and sit with your back as straight as possible.   • Avoid standing too long in one spot. If you can, rest one foot on a footstool or step while you are standing.  Change which foot you lean on every few minutes.   • Wear a special support girdle for pregnant women.   You can find these at maternity shops or a medical supply store.   • Use a footstool when you have to sit for long periods, so your legs are no dangling.  • Ask your partner or a friend for a back rub, or use a heating pad on low setting to relieve aches. Sometimes a warm, not hot, bath feels good too.  • Try to sleep on a firm, supportive mattress. Put a board under your mattress if it is too soft.   Use lost of pillows!  Wedge one under your belly when you are on your side and use one between your knees while on your side too.  Avoid lying flat on your back.  Lean into a pillow placed under your side instead.  • Do pelvic tilt exercises.  On your hands and knees like an angry cat.  Rock forward and backward a little to stretch out your back.   On your back, bend your knees, then lift your bottom up in the air so only your head, shoulders and  feet touch the floor.  Hold this position for a few seconds, then relax and do it again.   • Wear good walking shoes as much as possible.  Try not to wear shoes with a heel higher than 2 inches.  If your backache “comes and goes” in a regular pattern or if you have leakage or bleeding from your vagina, call your health care provider right away.    Hemorrhoids  Hemorrhoids are also called piles.  They are extra-large blood vessels in your rectum (where you have bowel movements) that itch and sometimes bleed.  They are caused    by the increased pressure of your baby in your pelvis (area between your hips).  The pressure of the baby blocks off the blood vessels and causes them to get too big.  Things that help keep hemorrhoids from happening to getting too bad include the following:  • Avoid constipation.  Eat lots of fruits and vegetables so you have a bowel movement every day if possible.  Ask your health care provider if you need a laxative to help you have regular bowel movements.  • Do not strain or push when having a bowel movement.  Breathe slowly, relax and let   it come out without pushing.  • Drink more fluids and eat foods high in fiber, like whole grains.  • Do not have anal sex (in which the man puts his penis in your anus, where bowel movements come out).  • Take stiz baths with cool water 2-3 times a day.  A sitz bath is a little tub that fits on your toilet and lets you soak your bottom without getting the rest of your body wet. You can buy a stiz bath kit at a drugstore or medical supply store.   • Use ice packs or witch hazel pads (like Tucks) to take the swelling down.   • Do Kegel exercises.  A Kegel exercise is when you tighten the muscles in your bottom. Next time you urinate, try to stop the urine.  The muscle tightening you do to stop your urine is a Kegel exercise.  After you figure out how, only do Kegel exercises when you are not urinating. Starting and stopping your urine flow too much can  lead to infections or bladder problems.  • Try to sit with your legs crossed tailor style (both feet tucked under the opposite leg, with the knees sticking out) or sit on hard chairs.  Do not sit too long in any chair.  • I you notice bleeding when you go to the bathroom, talk with your health care provider.     Leg Cramps  Leg cramps happen in pregnancy, but it is unclear why.  Things that can help prevent or lessen them include the following:  • Get more calcium in your diet.  Se the guide on Nutrition during Pregnancy for some ways of getting calcium in your diet. In addition to dairy products, there is calcium-fortified orange juice.  Ask your health care provider if you can take a calcium supplement (like Tums).   • Exercise.  Even walking 30 minutes a day can help.  And you can walk for 10 minutes 3 times a day for the same benefit.   • If you get a cramp, try straightening your leg.  Sometimes pointing your toe toward your knee helps.  Stand a foot or so from a wall and lean toward it keeping your knees straight and your feet flat on the floor.  • Massage or apply mild heat with a warm wash cloth or heating pad on low to the cramping area.  If you feel a hard knot or hot spot on your leg, especially in the calf (back of the lower leg that does not go away, call you health care provider right away.    Varicose Veins  Varicose veins show up as blue line under the skin usually on your lower legs.  Sometimes they are thick like cords.  They can be due to your hormones, to pressure on your pelvis from the baby, or to our family tendency (someone else in your family had them).  Things you can do to help present varicose veins includes the following:  • Avoid constrictive (tight) clothing, especially if it is tight in your waist or hip areas.  Also avoid knee socks or hose with elastic bands at the top.  • Try not to sit or stand for long periods without moving around.   • Use good posture and good body mechanics.   (See the section of this guide on back pain).    If you get varicose veins, these things can help keep them from bothering you or getting   worse:  • Wear good support hose.  You can buy support hose at the maternity shops or medical supplies stores.   • Raise your legs on a footstool when sitting.  Lie down and put your feet up higher than your head every chance you get.  • Talk with your HCP about other things to keep you comfortable.     Many women never have any of these discomforts during their pregnancy.  Others have a lot of discomforts.  Be sure to tell your Health Care Provider about anything that you find uncomfortable.  Write down your questions as they come up so you will remember to ask them during your next office visit or phone call.      Warning Signs: SECOND TRIMESTER    No one wants something to go wrong during her pregnancy.  It can be really temping to not tell anyone when something happens; you just hope it will go away on its own.  Usually something that worries you is not a big problem but it is important to talk to your health care provider any time you have a worry.  Most of the time, bigger problems can be prevented if you get help early enough.  During your second trimester, there are three warning signs that you should call your Health Care Provider about:    Bleeding  After your twentieth week or fifth month of pregnancy, bleeding can be due to several causes.  There can be problems with the placenta.  Placenta previa is when the placenta forms across the cervix (opening to the uterus): placenta abruption is when the placenta pulls ways from the inside of the uterus.  Sometimes sores or scratches on your cervix or vagina can cause bleeding.  Vaginal infections or polyps (see the guide on Sexually Transmitted Diseases) can cause bleeding.  Preterm labor may also cause bleeding.      You may or may not have pain with vaginal bleeding.  It is very important to call your Health Care Provider  or go to the hospital to be examined any time you have vaginal bleeding during your pregnancy.    Pain  Abdominal or pelvic (between your hips) pain during your pregnancy needs to be checked out by your Health Care Provider.       Non-pregnancy causes of pain need to be considered along with pregnancy causes.  Back pain that comes and goes in a pattern or does not go away at all need to be checked out.  Call your Health Care Provider or if the pain is really bad or sudden, go to the hospital where you plan to deliver so you can be examined.     Infection and Fever  When you are pregnant, you can have “normal” colds and infections.  But it is important to tell your Health Care Provider if you have:  • A fever of more than 100.4 degrees F or 38.0 C  • A burning sensation or pain when you urinate or a need to go to the bathroom often  • Green or yellow drainage from your nose, or a tendency to cough up green or yellow phlegm.  • Toothaches, bleeding gums, or sores in the mouth that do not go away.  • Vomiting or diarrhea that last more than an hour or so.  • Any symptom that worries you    If you have a fever, you need to increase the amount of fluids you drink. Fluids help   you flush infection out of your body.  They also help keep you from getting dehydrated (dried out), which is bad for you and the baby. If you have an infection it is also best to get more rest so that your body has the energy to fight the germs.   Antibiotics (medicines that treat infections) are safe during pregnancy and may be necessary to protect your baby from any possible harm from your infection.  Your Health Care Provider will order medicine if you need it.     Remember--you and your Health Care Provider are a team, working to help you have a safe pregnancy and a healthy baby.  Be sure to let your Health Care Provider know of anything that concerns you.  Ignoring or worrying about a problem won’t help it to go away! Getting help right away  can make a big difference in your health and your baby’s safety.

## 2012-06-06 NOTE — Progress Notes (Signed)
Helen Morrison is a 26 y.o. female presents for routine Prenatal Visit at [redacted]w[redacted]d     Subjective:  Doing well.  Denies complaints.  +FM, Denies VB or LOF.      OB History    Grav Para Term Preterm Abortions TAB SAB Ect Mult Living    2    1  1              ROS:   A comprehensive review of systems was negative.    Objective:    Filed Vitals:    06/06/12 1124   BP: 118/62   Height: 5\' 7"  (1.702 m)   Weight: 192 lb (87.091 kg)     Fundal Height: 18 cm  FHT: 164 bpm       Assessment:   26 y.o. year old G2 P65 female at [redacted]w[redacted]d weeks gestation    Plan:    Patient Active Problem List    Diagnosis Date Noted   . Supervision of normal first pregnancy 03/23/2012     Priority: High     Rh Positive/Rubella immune  -first trimester screen: Unable to obtain nuchal translucency due to fetal position. Recommend second trimester screening  Normal QUAD screen  <>Anatomic scan: scheduled for 12/13  Refused flu vaccine     . History of migraine headaches 04/13/2012     Was followed by neurology in [place].  Has not establish Neuro care here in [place].  Reports has not had any migraine headaches since her pregnancy began     . Vaginal bleeding in pregnancy 03/23/2012     USN for viability: IUP at 9.5 weks on 10/1  -Type and screen: O+  No further complaints of VB in early pregnancy       Pregnancy warning symptoms and common discomforts of second trimester were reviewed.  RTC in 4 weeks or PRN.

## 2012-06-09 ENCOUNTER — Other Ambulatory Visit: Payer: Self-pay

## 2012-06-16 ENCOUNTER — Ambulatory Visit: Payer: Self-pay

## 2012-06-16 ENCOUNTER — Other Ambulatory Visit: Payer: Self-pay | Admitting: Gastroenterology

## 2012-07-05 NOTE — L&D Delivery Note (Addendum)
Delivery Summary Note  Patient: Helen Morrison  Age: 27 y.o.  Date of Birth: June 28, 1986  [id number]  MRN: [id number]    Admission Summary  Date and time of admission: 10/15/2012  4:24 PM Attending Provider: Charlestine Night, MD Provider Group : [facility]  Active Hospital Problems    Diagnosis   . Marland Kitchen*VAVD   . PROM (premature rupture of membranes)     Allergies:   Pcn  Weight:  PrePregnancy Weight: 87.998 kg (194 lb) Weight: 92.534 kg (204 lb) Pregnancy weight change (kg): 4.54 kg   Obstetric History    G2   P1   T1   P0   A1   TAB0   SAB1   E0   M0   L1       Breast or Formula Feeding: Breast feeding;Formula feeding      Prenatal Labs  ABO RH Blood Type   Date Value Range Status   10/15/2012 O RH POS   Final        Antibody Screen   Date Value Range Status   10/15/2012 Negative   Final        Rubella IgG AB   Date Value Range Status   03/23/2012 POSITIVE   Final      TEST METHOD: EIA        HBV S Ag   Date Value Range Status   03/23/2012 NEG   Final        HIV 1&2 ANTIGEN/ANTIBODY   Date Value Range Status   03/23/2012 Nonreactive   Final      Dating Information  Patient's last menstrual period was 01/27/2012. EDD: 11/02/2012, by Last Menstrual Period  Information for the patient's newborn:  Anthia, Grajeda [[id number]]     Delivery Information  Boy [place]  Sex: female Gestational Age: 61.7 weeks. MRN: [id number] Juanito Doom, MD   Delivery Date/Time: 10/17/2012 4:19 PM   Delivery Type: Vaginal, Vacuum (Extractor)    Delivery Location: 316    Labor Onset Date/Time: 10/16/2012 6:28 PM Dilation Complete Date/Time: 4/15/20141:06 PM     Preterm labor: No Antenatal steroids:  Antibiotics received during labor:   Cervical ripening date/time:   Cervical ripening Type:         Rupture Date: 10/15/2012 Rupture Time: 10:00 AM  Rupture Type: Prolonged Color:   Induction:  Indications:  Augmentation: Oxytocin  Labor complications: Chorioamnionitis     Delivering clinician:  Purvis Kilts    Other personnel:      Provider Role    ANDRICOPOULOS, [place] NICU/SCN Resident   Lenn Cal NICU/SCN NP   Estrella Deeds M NICU/SCN RN   Irena Reichmann Resident   Rod Holler Delivery Nurse   Webb Silversmith Resident         Anesthesia Method: Epidural-;Pudendal;Local   Analgesics:    Presentation: Vertex Position: Middle Occiput Anterior    Shoulder Dystocia: No                                            Forceps:        Indications: Maternal Fatigue         Number of pulls: 0     Total forceps application time: 0 seconds       Forceps applied by: Burhans, [place] Welliver     Failed No  Vacuum:       Indications:  Maternal Fatigue     Vacuum type: Mushroom cup (mityvac/kiwi)     Vacuum application location: Outlet       First attempt time vacuum applied:  4:14 PM          Number of pop offs: 1     Number of pulls: 3     Total vacuum application time: 3 minutes       Vacuum applied by: Burhans     Failed No  Resuscitation: Tactile Stimulation;Bulb Suctioning  -----Comment: Infant vigorous and crying at birth. Warmed, dried, bulb suctioned. Temp 37.2, BG 87.   Living Status: Yes           APGARs Total Color Reflex irritability Breath Heart Rate Muscle Tone Assigned By   (greater than 7 no need for next measurement)   1 min 8  0  2  2  2  2   nicu   5 min 9  1  2  2  2  2   nicu   10 min           15 min           20 min           25 min                 30 min                   Birth Weight: 2829 g (6 lb 3.8 oz) Height:  Head Circumference:  Observed Anomalies:     Cord: 3 Vessels     Complications: Cord around       Cord around: neck      Number of loops: 1       Interventions: reduced     Cord blood disposition: Lab     Gases sent: Yes       Stem cell collection -by MD-: No  Maternal Info:   Placenta Delivery Date/Time: 4/15  6:30 AM     Removal: Spontaneous     Appearance: Intact     Disposition: pathology  Bonding:      Skin To Skin: No     Why: Infant condition at delivery  Stages of Labor:          Stage One:  18h  1m           Stage Two:  3h  48m          Stage Three:  h  m  Episiotomy: None     Perineal lacerations:                                        Delivery est. blood loss (mL): 600.00       Needle Count: Correct        Sponge Count: Correct  Procedures:            Patient is a 27 y.o. G2P1011 at [redacted]w[redacted]d weeks presented with PROM, she was augmented with pitocin and a cervical foley and slowly progressed in labor, she developed chorioamnionitis and was treated with gentamicin and vancomycin.  She then reached fully dilated and began pushing.  After two hours, she had made some progress but was expressinging maternal exhaustion, given the presence of infection the decision was made to proceed with an assisted delivery secondary to maternal exhaustion.  The perineum were prepped  with hibaclens and the bladder drained via straight catheter.  The patient was in dorsal lithotomy position and vaginal exam revealed vertex in ROA position and +1 station.  Her epidural was rebolused and a pudendal block of 10cc on each side was peformed.  The risks and benefits of forceps assisted vaginal delivery were reviewed with the patient and she verbally consented.  Two attempts were made to place the Simpson forceps however the blades could not be articulated appropriately, and the decision was made to proceed vacuum assisted vaginal delivery instead.  The risks and benefits of vacuum were reviewed with the patient she verbally consented.  The Kiwi cup was applied to fetal vertex at the median flexing point and subsequent vaginal exam revealed adequate application.  Gentle axial traction was applied in conjunction with 3 maternal expulsive efforts.  The vacuum was released in between contractions and there was 1 pop-off.  This resulted in delivery of the vertex over an intact perineum.  The infant was bulb suctioned on the perineum, fully delivered and the cord clamped and cut.  The infant was handed to the awaiting Pediatric team.  The placenta  delivered spontaneously, and was intact with 3VC.  A small PPH was encountered, 800 mcg of misoprostol was administered rectally.  The perineum was inspected and a 3 laceration was repaired with 3-0 vicryl and 0-vicryl in the usually fashion with good hemostasis noted.  Repeat rectal exam confirmed good rectal sphincter tone.  The vagina and cervix were intact on inspection.  Mom and baby tolerated delivery well.  Infant was transferred to the NICU for further evaluation.   EBL 600 cc.    Webb Silversmith MD  OB/GYN Resident, PGY-3  Pager # [phone]  10/17/2012  6:07 PM    I was present throughout the entire procedure.    Purvis Kilts, MD

## 2012-07-06 ENCOUNTER — Encounter: Payer: Self-pay | Admitting: Reproductive Endocrinology and Infertility

## 2012-07-11 ENCOUNTER — Encounter: Payer: Self-pay | Admitting: Obstetrics and Gynecology

## 2012-07-11 ENCOUNTER — Ambulatory Visit: Payer: Self-pay | Admitting: Obstetrics and Gynecology

## 2012-07-11 VITALS — BP 120/67 | Ht 67.0 in | Wt 201.9 lb

## 2012-07-11 DIAGNOSIS — O99891 Other specified diseases and conditions complicating pregnancy: Secondary | ICD-10-CM

## 2012-07-11 DIAGNOSIS — M549 Dorsalgia, unspecified: Secondary | ICD-10-CM

## 2012-07-11 DIAGNOSIS — Z3400 Encounter for supervision of normal first pregnancy, unspecified trimester: Secondary | ICD-10-CM

## 2012-07-11 DIAGNOSIS — M419 Scoliosis, unspecified: Secondary | ICD-10-CM

## 2012-07-11 LAB — POCT URINALYSIS DIPSTICK
Bilirubin,Ur: NEGATIVE
Blood,UA POCT: NEGATIVE
Glucose,UA POCT: NORMAL
Ketones,UA POCT: NEGATIVE
Lot #: 22041202
Nitrite,UA POCT: NEGATIVE
PH,UA POCT: 6 (ref 5–8)
Protein,UA POCT: NEGATIVE mg/dL
Specific gravity,UA POCT: 1.015 (ref 1.002–1.03)
Urobilinogen,UA: NORMAL

## 2012-07-11 MED ORDER — RELIEF MATERNITY MISC *A*
Status: DC
Start: 2012-07-11 — End: 2013-02-05

## 2012-07-11 MED ORDER — RELIEF MATERNITY MISC *A*
Status: DC
Start: 2012-07-11 — End: 2012-07-11

## 2012-07-11 NOTE — Progress Notes (Signed)
SUBJECTIVE  Denies LOF, VB, ctx.  +FM  -Patient reports back and sciatic nerve pain diagnosed last visit and wants to know what she can do to help. She reports history of scoliosis and has history of back discomforts.Denies any back surgery or MVA.     Patient Active Problem List   Diagnosis Code   . Vaginal bleeding in pregnancy 641.90   . Supervision of normal first pregnancy V22.0   . History of migraine headaches V12.49     OBJECTIVE  Filed Vitals:    07/11/12 1858   BP: 120/67   Height: 5\' 7"  (1.702 m)   Weight: 201 lb 14.4 oz (91.581 kg)     FH: 22 cm  FHR: 156  Recent Results (from the past 24 hour(s))   POCT URINALYSIS DIPSTICK    Collection Time    07/11/12  7:00 PM       Result Value Range    Specific gravity,UA POCT 1.015  1.002 - 1.03    PH,UA POCT 6.0  5 - 8    Leuk Esterase,UA POCT Trace (*) Negative    Nitrite,UA POCT Negative  Negative    Protein,UA POCT Negative  Negative mg/dL    Glucose,UA POCT Normal  Normal    Ketones,UA POCT Negative  Negative    Urobilinogen,UA Normal      Bilirubin,Ur Negative  Negative    Blood,UA POCT Negative  Negative    Exp date 8/14      Lot # [id number]         ASSESSMENT/PLAN  27 y.o. G2P0010 at [redacted]w[redacted]d   PNC :Reviewed 2nd Trimester expectations and warning s/s .Nutrition and hydration reviewed. PTL, SROM and decreased fetal movement.  -Back pain: reviewed comfort measures, heat massage and exercises. Rx for maternity support belt. Referral to PT. Will not patient reported scoliosis hx in problem list   -Labs: 1 hour Glucola, cbc and urine cx  - Influenza vaccine offered and declined  Return to clinic 4 weeks or prn

## 2012-07-11 NOTE — Patient Instructions (Signed)
wOCommon Discomforts:  SECOND TRIMESTER    You have made it through the first third of your pregnancy! The middle 3 months--the second trimester--are usually the easiest. Although some women still have nausea and feel especially tired for their whole pregnancy, most women are feeling much better.   Your growing baby and uterus can cause some discomforts, though.  Here are 4 common ones and ways to deal with them:    Back Pain  Lower back pain can be caused by muscle strain (from stretching, bending or lifting).   Sometimes your hormones change and leave you more at risk for back pain. Weak muscles also strain and hurt more easily.  Here are some things that can lesson you discomfort:  • Use proper body mechanics by lifting with your back straight and your knees bent.  Do not twist while lifting or pulling.  Stand and sit with your back as straight as possible.   • Avoid standing too long in one spot. If you can, rest one foot on a footstool or step while you are standing.  Change which foot you lean on every few minutes.   • Wear a special support girdle for pregnant women.   You can find these at maternity shops or a medical supply store.   • Use a footstool when you have to sit for long periods, so your legs are no dangling.  • Ask your partner or a friend for a back rub, or use a heating pad on low setting to relieve aches. Sometimes a warm, not hot, bath feels good too.  • Try to sleep on a firm, supportive mattress. Put a board under your mattress if it is too soft.   Use lost of pillows!  Wedge one under your belly when you are on your side and use one between your knees while on your side too.  Avoid lying flat on your back.  Lean into a pillow placed under your side instead.  • Do pelvic tilt exercises.  On your hands and knees like an angry cat.  Rock forward and backward a little to stretch out your back.   On your back, bend your knees, then lift your bottom up in the air so only your head, shoulders  and feet touch the floor.  Hold this position for a few seconds, then relax and do it again.   • Wear good walking shoes as much as possible.  Try not to wear shoes with a heel higher than 2 inches.  If your backache “comes and goes” in a regular pattern or if you have leakage or bleeding from your vagina, call your health care provider right away.    Hemorrhoids  Hemorrhoids are also called piles.  They are extra-large blood vessels in your rectum (where you have bowel movements) that itch and sometimes bleed.  They are caused    by the increased pressure of your baby in your pelvis (area between your hips).  The pressure of the baby blocks off the blood vessels and causes them to get too big.  Things that help keep hemorrhoids from happening to getting too bad include the following:  • Avoid constipation.  Eat lots of fruits and vegetables so you have a bowel movement every day if possible.  Ask your health care provider if you need a laxative to help you have regular bowel movements.  • Do not strain or push when having a bowel movement.  Breathe slowly, relax and let   it come out without pushing.  • Drink more fluids and eat foods high in fiber, like whole grains.  • Do not have anal sex (in which the man puts his penis in your anus, where bowel movements come out).  • Take stiz baths with cool water 2-3 times a day.  A sitz bath is a little tub that fits on your toilet and lets you soak your bottom without getting the rest of your body wet. You can buy a stiz bath kit at a drugstore or medical supply store.   • Use ice packs or witch hazel pads (like Tucks) to take the swelling down.   • Do Kegel exercises.  A Kegel exercise is when you tighten the muscles in your bottom. Next time you urinate, try to stop the urine.  The muscle tightening you do to stop your urine is a Kegel exercise.  After you figure out how, only do Kegel exercises when you are not urinating. Starting and stopping your urine flow too much  can lead to infections or bladder problems.  • Try to sit with your legs crossed tailor style (both feet tucked under the opposite leg, with the knees sticking out) or sit on hard chairs.  Do not sit too long in any chair.  • I you notice bleeding when you go to the bathroom, talk with your health care provider.     Leg Cramps  Leg cramps happen in pregnancy, but it is unclear why.  Things that can help prevent or lessen them include the following:  • Get more calcium in your diet.  Se the guide on Nutrition during Pregnancy for some ways of getting calcium in your diet. In addition to dairy products, there is calcium-fortified orange juice.  Ask your health care provider if you can take a calcium supplement (like Tums).   • Exercise.  Even walking 30 minutes a day can help.  And you can walk for 10 minutes 3 times a day for the same benefit.   • If you get a cramp, try straightening your leg.  Sometimes pointing your toe toward your knee helps.  Stand a foot or so from a wall and lean toward it keeping your knees straight and your feet flat on the floor.  • Massage or apply mild heat with a warm wash cloth or heating pad on low to the cramping area.  If you feel a hard knot or hot spot on your leg, especially in the calf (back of the lower leg that does not go away, call you health care provider right away.    Varicose Veins  Varicose veins show up as blue line under the skin usually on your lower legs.  Sometimes they are thick like cords.  They can be due to your hormones, to pressure on your pelvis from the baby, or to our family tendency (someone else in your family had them).  Things you can do to help present varicose veins includes the following:  • Avoid constrictive (tight) clothing, especially if it is tight in your waist or hip areas.  Also avoid knee socks or hose with elastic bands at the top.  • Try not to sit or stand for long periods without moving around.   • Use good posture and good body  mechanics.  (See the section of this guide on back pain).    If you get varicose veins, these things can help keep them from bothering you or getting   worse:  • Wear good support hose.  You can buy support hose at the maternity shops or medical supplies stores.   • Raise your legs on a footstool when sitting.  Lie down and put your feet up higher than your head every chance you get.  • Talk with your HCP about other things to keep you comfortable.     Many women never have any of these discomforts during their pregnancy.  Others have a lot of discomforts.  Be sure to tell your Health Care Provider about anything that you find uncomfortable.  Write down your questions as they come up so you will remember to ask them during your next office visit or phone call.      Warning Signs: SECOND TRIMESTER    No one wants something to go wrong during her pregnancy.  It can be really temping to not tell anyone when something happens; you just hope it will go away on its own.  Usually something that worries you is not a big problem but it is important to talk to your health care provider any time you have a worry.  Most of the time, bigger problems can be prevented if you get help early enough.  During your second trimester, there are three warning signs that you should call your Health Care Provider about:    Bleeding  After your twentieth week or fifth month of pregnancy, bleeding can be due to several causes.  There can be problems with the placenta.  Placenta previa is when the placenta forms across the cervix (opening to the uterus): placenta abruption is when the placenta pulls ways from the inside of the uterus.  Sometimes sores or scratches on your cervix or vagina can cause bleeding.  Vaginal infections or polyps (see the guide on Sexually Transmitted Diseases) can cause bleeding.  Preterm labor may also cause bleeding.      You may or may not have pain with vaginal bleeding.  It is very important to call your Health  Care Provider or go to the hospital to be examined any time you have vaginal bleeding during your pregnancy.    Pain  Abdominal or pelvic (between your hips) pain during your pregnancy needs to be checked out by your Health Care Provider.       Non-pregnancy causes of pain need to be considered along with pregnancy causes.  Back pain that comes and goes in a pattern or does not go away at all need to be checked out.  Call your Health Care Provider or if the pain is really bad or sudden, go to the hospital where you plan to deliver so you can be examined.     Infection and Fever  When you are pregnant, you can have “normal” colds and infections.  But it is important to tell your Health Care Provider if you have:  • A fever of more than 100.4 degrees F or 38.0 C  • A burning sensation or pain when you urinate or a need to go to the bathroom often  • Green or yellow drainage from your nose, or a tendency to cough up green or yellow phlegm.  • Toothaches, bleeding gums, or sores in the mouth that do not go away.  • Vomiting or diarrhea that last more than an hour or so.  • Any symptom that worries you    If you have a fever, you need to increase the amount of fluids you drink. Fluids help   you flush infection out of your body.  They also help keep you from getting dehydrated (dried out), which is bad for you and the baby. If you have an infection it is also best to get more rest so that your body has the energy to fight the germs.   Antibiotics (medicines that treat infections) are safe during pregnancy and may be necessary to protect your baby from any possible harm from your infection.  Your Health Care Provider will order medicine if you need it.     Remember--you and your Health Care Provider are a team, working to help you have a safe pregnancy and a healthy baby.  Be sure to let your Health Care Provider know of anything that concerns you.  Ignoring or worrying about a problem won’t help it to go away! Getting help  right away can make a big difference in your health and your baby’s safety.

## 2012-07-31 ENCOUNTER — Ambulatory Visit: Payer: Self-pay | Admitting: Rehabilitative and Restorative Service Providers"

## 2012-07-31 NOTE — Progress Notes (Signed)
Physical Therapy Daily Flowsheet:  *Please see Physical Therapy Exercise Flowsheet for details regarding exercises completed this session.*   07/31/12 1100   Overview   Missed Visit No showed   Erich Montane, PT

## 2012-08-09 ENCOUNTER — Encounter: Payer: Self-pay | Admitting: Reproductive Endocrinology and Infertility

## 2012-08-09 ENCOUNTER — Ambulatory Visit: Payer: Self-pay | Admitting: Reproductive Endocrinology and Infertility

## 2012-08-09 VITALS — BP 112/69 | Ht 67.0 in | Wt 199.0 lb

## 2012-08-09 DIAGNOSIS — Z3400 Encounter for supervision of normal first pregnancy, unspecified trimester: Secondary | ICD-10-CM

## 2012-08-09 LAB — POCT URINALYSIS DIPSTICK
Bilirubin,Ur: NEGATIVE
Glucose,UA POCT: NORMAL
Ketones,UA POCT: NEGATIVE
Leuk Esterase,UA POCT: NEGATIVE
Lot #: 22041202
Nitrite,UA POCT: NEGATIVE
PH,UA POCT: 7 (ref 5–8)
Protein,UA POCT: NEGATIVE mg/dL
Specific gravity,UA POCT: 1.015 (ref 1.002–1.03)
Urobilinogen,UA: NORMAL

## 2012-08-09 NOTE — Patient Instructions (Signed)
General Information:    What is it? A glucose screen is a blood test to find out if you have gestational diabetes. Gestational diabetes is a condition that occurs in about 4% of women during pregnancy. It causes high glucose levels in your blood. Glucose is the simplest form of sugar. Higher than normal blood sugar can be harmful to the mother and to the unborn baby.     Why do I need it? You may be at risk for gestational diabetes if you have a family history of diabetes. If you have given birth to a large baby or have glucose in your urine, you are also at risk. Many caregivers order the glucose screen for gestational diabetes between 24 and 28 weeks of pregnancy.     How do I get ready for the test? Your caregiver will tell you when to have your blood test done. Unlike other blood sugar tests, this one may be done before or after eating. You will be given a sugar drink and your blood is taken one hour later. The results of this test can tell if you have gestational diabetes or need further testing.    How is the specimen collected? A caregiver will put a wide rubber strap around your arm and tighten it. Your skin will be cleaned with alcohol. A small needle attached to a special test tube will be put into a vein in your arm or hand. The tube has suction to pull the blood into it. When the tube is full, the rubber strap, needle and tube are removed. The caregiver will press a piece of cotton where the needle was removed. You may be asked to hold the cotton on the site for a few minutes to help stop the bleeding. Tape may then be put over the cotton on your arm.    What do I do after the test? You may remove the tape and cotton in about 20 to 30 minutes. Call your caregiver to get the results of your test. Your caregiver will explain what your test results mean for you. Follow the instructions of your caregiver.     Reviewed 04/2010

## 2012-08-09 NOTE — Progress Notes (Signed)
Helen Morrison is a 27 y.o. female presents for routine Prenatal Visit at [redacted]w[redacted]d     Subjective:  Still with back pain and sciatic nerve pain, was referred to PT at last visit, missed her appointment today.  Will reschedule.  +FM, Denies VB or LOF.      OB History    Grav Para Term Preterm Abortions TAB SAB Ect Mult Living    2    1  1              ROS:   Pertinent items are noted in HPI.    Objective:    Filed Vitals:    08/09/12 1339   BP: 112/69   Height: 5\' 7"  (1.702 m)   Weight: 199 lb (90.266 kg)     Fundal Height: 28 cm  FHT: 143 bpm       Assessment:   26 y.o. year old G2  P45 female at [redacted]w[redacted]d weeks gestation    Plan:    Patient Active Problem List    Diagnosis Date Noted   . Supervision of normal first pregnancy 03/23/2012     Priority: High     Rh Positive/Rubella immune  -first trimester screen: Unable to obtain nuchal translucency due to fetal position. Recommend second trimester screening  Normal QUAD screen  -Anatomic scan: completed; anterior placenta  <>1hr GTT      Refused flu vaccine     . Scoliosis 07/11/2012     patient reported     . History of migraine headaches 04/13/2012     Was followed by neurology in [place].  Has not establish Neuro care here in [place].  Reports has not had any migraine headaches since her pregnancy began     . Vaginal bleeding in pregnancy 03/23/2012     USN for viability: IUP at 9.5 weks on 10/1  -Type and screen: O+  No further complaints of VB in early pregnancy     Reminded to have 1hr GTT completed   Discussed Tdap; wants to consider, will readdress at next visit.  Pregnancy warning symptoms and kick counts were reviewed.  RTC in 2 weeks or PRN.      Carlynn Herald, NP

## 2012-08-10 LAB — AEROBIC CULTURE: Aerobic Culture: 0

## 2012-08-11 ENCOUNTER — Ambulatory Visit
Admit: 2012-08-11 | Discharge: 2012-08-11 | Disposition: A | Discharge status: Home / Self Care | Payer: Self-pay | Source: Ambulatory Visit | Attending: Obstetrics and Gynecology | Admitting: Obstetrics and Gynecology

## 2012-08-11 LAB — CBC
Hematocrit: 30 % — ABNORMAL LOW (ref 34–45)
Hemoglobin: 10 g/dL — ABNORMAL LOW (ref 11.2–15.7)
MCV: 86 fL (ref 79–95)
Platelets: 232 10*3/uL (ref 160–370)
RBC: 3.5 MIL/uL — ABNORMAL LOW (ref 3.9–5.2)
RDW: 13.4 % (ref 11.7–14.4)
WBC: 7.3 10*3/uL (ref 4.0–10.0)

## 2012-08-11 LAB — GLUCOSE TOLERANCE, 1 HOUR: Glucose,50gm 1HR: 91 mg/dL (ref 63–135)

## 2012-08-12 LAB — AEROBIC CULTURE: Aerobic Culture: 0

## 2012-08-23 ENCOUNTER — Encounter: Payer: Self-pay | Admitting: Reproductive Endocrinology and Infertility

## 2012-08-23 ENCOUNTER — Telehealth: Payer: Self-pay

## 2012-08-23 NOTE — Telephone Encounter (Signed)
Spoke with patient, patient thought her appointment was next Monday and not today. Rescheduled patient with [facility]CP for Friday 09/01/12. Forwarded to [employer]

## 2012-09-01 ENCOUNTER — Encounter: Payer: Self-pay | Admitting: Reproductive Endocrinology and Infertility

## 2012-09-01 ENCOUNTER — Ambulatory Visit: Payer: Self-pay | Admitting: Reproductive Endocrinology and Infertility

## 2012-09-01 VITALS — BP 126/72 | Ht 67.0 in | Wt 206.0 lb

## 2012-09-01 DIAGNOSIS — R300 Dysuria: Secondary | ICD-10-CM

## 2012-09-01 LAB — POCT URINALYSIS DIPSTICK
Bilirubin,Ur: NEGATIVE
Blood,UA POCT: NEGATIVE
Glucose,UA POCT: NORMAL
Lot #: 22041202
Nitrite,UA POCT: NEGATIVE
PH,UA POCT: 6 (ref 5–8)
Protein,UA POCT: NEGATIVE mg/dL
Specific gravity,UA POCT: 1.015 (ref 1.002–1.03)
Urobilinogen,UA: NORMAL

## 2012-09-01 MED ORDER — NITROFURANTOIN MONOHYD MACRO 100 MG PO CAPS *I*
100.0000 mg | ORAL_CAPSULE | Freq: Two times a day (BID) | ORAL | Status: DC
Start: 2012-09-01 — End: 2012-09-01

## 2012-09-01 MED ORDER — NITROFURANTOIN MONOHYD MACRO 100 MG PO CAPS *I*
100.0000 mg | ORAL_CAPSULE | Freq: Two times a day (BID) | ORAL | Status: DC
Start: 2012-09-01 — End: 2012-10-13

## 2012-09-01 NOTE — Patient Instructions (Signed)
Nitrofurantoin (nye troe fyoor AN toyn)    Brand Names: U.S. Furadantin; Macrobid; Macrodantin   Brand Names: [place] Apo-Nitrofurantoin®; Macrobid®; Macrodantin®; Novo-Furantoin; Teva-Nitrofurantoin   Warning   • Sometimes drugs are not safe when you take them with certain other drugs. Taking them together can cause bad side effects. This is one of those drugs. Be sure to talk to your doctor about all the drugs you take.   What is this drug used for?   • It is used to stop or treat a bladder infection.   What do I need to tell my doctor BEFORE I take this drug?   • If you have an allergy to nitrofurantoin or any other part of this drug.   • If you are allergic to any drugs like this one, any other drugs, foods, or other substances. Tell your doctor about the allergy and what signs you had, like rash; hives; itching; shortness of breath; wheezing; cough; swelling of face, lips, tongue, or throat; or any other signs.   • If you have kidney disease.   • If this drug caused liver problems before.   • If you are more than [redacted] weeks pregnant.   Tell your doctor and pharmacist about all of your drugs (prescription or OTC, natural products, vitamins) and health problems. You must check to make sure that it is safe for you to take this drug with all of your drugs and health problems. Do not start, stop, or change the dose of any drug without checking with your doctor.   What are some things I need to know or do while I take this drug?   • Tell dentists, surgeons, and other doctors that you use this drug.   • Be careful if you have G6PD deficiency. Anemia may happen.   • Have your blood work checked if you are on this drug for a long time. Talk with your doctor.   • Avoid beer, wine, or mixed drinks.   • Tell your doctor if you are pregnant or plan on getting pregnant. You will need to talk about the benefits and risks of using this drug while you are pregnant.   • Tell your doctor if you are breast-feeding. You will  need to talk about any risks to your baby.   • If you think there has been an overdose, call your poison control center or get medical care right away. Be ready to tell or show what was taken, how much, and when it happened.   What are some side effects that I need to call my doctor about right away?   WARNING/CAUTION: Even though it may be rare, some people may have very bad and sometimes deadly side effects when taking a drug. Tell your doctor or get medical help right away if you have any of the following signs or symptoms that may be related to a very bad side effect:   • Signs of an allergic reaction, like rash; hives; itching; red, swollen, blistered, or peeling skin with or without fever; wheezing; tightness in the chest or throat; trouble breathing or talking; unusual hoarseness; or swelling of the mouth, face, lips, tongue, or throat.   • Chest pain or pressure.   • Numbness or tingling in your hands or feet.   • Trouble breathing.   • Cough that does not go away.   • Very upset stomach or throwing up.   • Very loose stools (diarrhea), even after drug is stopped.   •   Yellow skin or eyes.   • Not able to eat.   • Feeling very tired or weak.   What are some other side effects of this drug?   All drugs may cause side effects. However, many people have no side effects or only have minor side effects. Call your doctor or get medical help if any of these side effects or any other side effects bother you or do not go away:   • Headache.   • Upset stomach or throwing up. Many small meals, good mouth care, sucking hard, sugar-free candy, or chewing sugar-free gum may help.   • Change in color of urine to black, brown, or yellow.   • Harm to the lungs may rarely happen.   These are not all of the side effects that may occur. If you have questions about side effects, call your doctor. Call your doctor for medical advice about side effects.   You may report side effects to your national health agency.   How is this  drug best taken?   Use this drug as ordered by your doctor. Read and follow the dosing on the label closely.   • To gain the most benefit, do not miss doses.   • Use as you have been told, even if you feel well.   • Take this drug with food.   Liquid:    • Shake well before use.   • Mix liquid with water, milk, or fruit juice before drinking.   • Those who have feeding tubes may also use the liquid. Flush the feeding tube before and after this drug is given.   What do I do if I miss a dose?   • Take a missed dose as soon as you think about it.   • If it is close to the time for your next dose, skip the missed dose and go back to your normal time.   • Do not take 2 doses at the same time or extra doses.   How do I store and/or throw out this drug?   Capsule:    • Store at room temperature.   • Store in a dry place. Do not store in a bathroom.   Liquid:    • Store at room temperature. Do not freeze.   • Protect from light.   • Keep all drugs out of the reach of children and pets.   • Check with your pharmacist about how to throw out unused drugs.    General drug facts   • If your symptoms or health problems do not get better or if they become worse, call your doctor.    • Do not share your drugs with others and do not take anyone else's drugs.   • Keep a list of all your drugs (prescription, natural products, vitamins, OTC) with you. Give this list to your doctor.   • Talk with the doctor before starting any new drug, including prescription or OTC, natural products, or vitamins.   • Some drugs may have another patient information leaflet. If you have any questions about this drug, please talk with your doctor, pharmacist, or other health care provider.   Disclaimer   This information should not be used to decide whether or not to take this medicine or any other medicine. Only your healthcare provider has the knowledge and training to decide which medicines are right for you. This information does not endorse any  medicine as safe, effective,   or approved for treating any patient or health condition. This is only a brief summary of general information about this medicine. It does NOT include all information about the possible uses, directions, warnings, precautions, interactions, adverse effects, or risks that may apply to this medicine. This information is not specific medical advice and does not replace information you receive from your healthcare provider. You must talk with your healthcare provider for complete information about the risks and benefits of using this medicine.   Copyright   All content copyright © 1978-2013 [employer] or its respective owners. All Rights Reserved.

## 2012-09-01 NOTE — Progress Notes (Signed)
Kaydee Hartness is a 27 y.o. female presents for routine Prenatal Visit at [redacted]w[redacted]d     Subjective:  Complains of urinary frequency and burning with urination with post void pressure.  +FM, Denies VB or LOF.      OB History    Grav Para Term Preterm Abortions TAB SAB Ect Mult Living    2    1  1              ROS:   Pertinent items are noted in HPI.    Objective:    Filed Vitals:    09/01/12 1313   BP: 126/72   Height: 5\' 7"  (1.702 m)   Weight: 206 lb (93.441 kg)     Fundal Height: 31 cm  FHT: 145 bpm       Assessment:   27 y.o. year old G2 P25 female at [redacted]w[redacted]d weeks gestation    Plan:    Patient Active Problem List    Diagnosis Date Noted   . Supervision of normal first pregnancy 03/23/2012     Priority: High     Rh Positive/Rubella immune  -first trimester screen: Unable to obtain nuchal translucency due to fetal position. Recommend second trimester screening  Normal QUAD screen  -Anatomic scan: completed; anterior placenta  -1hr GTT: 91    Gender: SURPRISE!  Breast/bottlefeed    Refused flu vaccine     . Scoliosis 07/11/2012     patient reported     . History of migraine headaches 04/13/2012     Was followed by neurology in [place].  Has not establish Neuro care here in [place].  Reports has not had any migraine headaches since her pregnancy began     . Vaginal bleeding in pregnancy 03/23/2012     USN for viability: IUP at 9.5 weks on 10/1  -Type and screen: O+  No further complaints of VB in early pregnancy     Normal UA in the office; Will send urine for culture and sensitivity.  Prescription sent for Macrobid 100 mg 1 tab po bid x 7 days sent for prophylaxis; increase PO fluids; report any unresolved or worsening symptoms.  Pregnancy warning symptoms, kick counts and symptoms of labor were reviewed.  RTC in 2 weeks or PRN.  Tdap discussion at next visit.         Carlynn Herald, NP

## 2012-09-02 LAB — AEROBIC CULTURE: Aerobic Culture: 0

## 2012-09-15 ENCOUNTER — Encounter: Payer: Self-pay | Admitting: Reproductive Endocrinology and Infertility

## 2012-09-18 ENCOUNTER — Encounter: Payer: Self-pay | Admitting: Reproductive Endocrinology and Infertility

## 2012-09-18 ENCOUNTER — Ambulatory Visit: Payer: Self-pay | Admitting: Reproductive Endocrinology and Infertility

## 2012-09-18 VITALS — BP 127/72 | Ht 67.0 in | Wt 208.0 lb

## 2012-09-18 DIAGNOSIS — Z3480 Encounter for supervision of other normal pregnancy, unspecified trimester: Secondary | ICD-10-CM

## 2012-09-18 DIAGNOSIS — Z3400 Encounter for supervision of normal first pregnancy, unspecified trimester: Secondary | ICD-10-CM

## 2012-09-18 LAB — POCT URINALYSIS DIPSTICK
Bilirubin,Ur: NEGATIVE
Blood,UA POCT: NEGATIVE
Glucose,UA POCT: NORMAL
Ketones,UA POCT: NEGATIVE
Leuk Esterase,UA POCT: 1 — AB
Lot #: 22041202
Nitrite,UA POCT: NEGATIVE
PH,UA POCT: 7 (ref 5–8)
Protein,UA POCT: NEGATIVE mg/dL
Specific gravity,UA POCT: 1.005 (ref 1.002–1.03)
Urobilinogen,UA: NORMAL

## 2012-09-18 NOTE — Progress Notes (Signed)
Helen Morrison is a 27 y.o. female presents for routine Prenatal Visit at [redacted]w[redacted]d     Subjective:  Doing well.  Denies complaints.  +FM, Denies VB or LOF.      OB History    Grav Para Term Preterm Abortions TAB SAB Ect Mult Living    2    1  1              ROS:   Pertinent items are noted in HPI.    Objective:    Filed Vitals:    09/18/12 1354   BP: 127/72   Height: 5\' 7"  (1.702 m)   Weight: 208 lb (94.348 kg)     Fundal Height: 32.5 cm  FHT: 144 bpm       Assessment:   27 y.o. year old G2 P25 female at [redacted]w[redacted]d weeks gestation    Plan:    Patient Active Problem List    Diagnosis Date Noted   . Supervision of normal first pregnancy 03/23/2012     Priority: High     Rh Positive/Rubella immune  -first trimester screen: Unable to obtain nuchal translucency due to fetal position. Recommend second trimester screening  Normal QUAD screen  -Anatomic scan: completed; anterior placenta  -1hr GTT: 91    Gender: SURPRISE!  Breast/bottlefeed    Refused flu vaccine  <> Tdap     . Scoliosis 07/11/2012     patient reported     . History of migraine headaches 04/13/2012     Was followed by neurology in [place].  Has not establish Neuro care here in [place].  Reports has not had any migraine headaches since her pregnancy began     . Vaginal bleeding in pregnancy 03/23/2012     USN for viability: IUP at 9.5 weks on 10/1  -Type and screen: O+  No further complaints of VB in early pregnancy     Declined Tdap today; will offer again at next appointment.  Pregnancy warning symptoms were reviewed.  RTC in 2 weeks or PRN.      Carlynn Herald, NP

## 2012-10-06 ENCOUNTER — Telehealth: Payer: Self-pay

## 2012-10-06 ENCOUNTER — Encounter: Payer: Self-pay | Admitting: Reproductive Endocrinology and Infertility

## 2012-10-06 NOTE — Telephone Encounter (Signed)
Called and left message for the patient to reschedule her appointment. Abner Greenspan, RN notified and letter sent.

## 2012-10-11 NOTE — Telephone Encounter (Signed)
Patient called and rescheduled to 10/16/12.

## 2012-10-13 ENCOUNTER — Telehealth: Payer: Self-pay | Admitting: Obstetrics & Gynecology

## 2012-10-13 ENCOUNTER — Encounter: Payer: Self-pay | Admitting: Obstetrics and Gynecology

## 2012-10-13 ENCOUNTER — Observation Stay
Admit: 2012-10-13 | Disposition: A | Discharge status: Home / Self Care | Payer: Self-pay | Source: Ambulatory Visit | Attending: Obstetrics and Gynecology | Admitting: Obstetrics and Gynecology

## 2012-10-13 MED ORDER — FERROUS SULFATE 325 (65 FE) MG PO TABS *WRAPPED* *I*
325.0000 mg | ORAL_TABLET | Freq: Two times a day (BID) | ORAL | Status: AC
Start: 2012-10-13 — End: 2012-11-12

## 2012-10-13 NOTE — Progress Notes (Addendum)
OB Triage Admission: Nursing Note    Gestational age: 28 1/7 weeks    Pt arrived to [facility] triage reporting ROM for clear fluid around 1900. Pt reports continued +LOF;clear. No fluid apparent on peri-pad at this time. -VB, +FM. Pt reports mild cramping/tightening q 5 min. CEFM applied. Dr. Bonnell Public aware of pt arrival.    Pt reported medications: PNV    Pt reported complications/medical problems: none    Signed by: Monia Sabal, RN

## 2012-10-13 NOTE — Telephone Encounter (Signed)
27yo G2P0010 at [redacted]w[redacted]d GA calls c/o a small gush of clea fluid, followed by another gush of clear fluid associated with passing yellow sticky vaginal discharge.  She denies contractions.  She denies any vaginal bleeding or decreased fetal movement.  She is directed to [facility] OB triage to r/o ROM.    Jeanette Caprice, MD

## 2012-10-13 NOTE — OB Triage Note (Signed)
OB TRIAGE NOTE:    Chief Complaint: vaginal discharge      History of Present Ilness:   27 y.o. G2P0010 at [redacted]w[redacted]d presents to triage with complaints of  a small gush of clear fluid these evening, followed by another gush of clear fluid associated with passing yellow sticky vaginal discharge. She c/o mild craming, but denies any contractions or urinary symptoms. She denies any vaginal bleeding or decreased fetal movement    PREGNANCY RISKS:  Anemia, Hct=30 on 08/11/12  First trimester vaginal bleeding, resolved  Migraine headaches - improved in pregnancy, on no medications, followed by neurologist in [facility], has not yet established care with a neurologist in [place]  Abnormal pap smear    Vital Signs:   Filed Vitals:    10/13/12 2319   BP:    Pulse: 100   Temp:    Resp:    Height:    Weight:      BP: (127)/(80)   Temp:  [37.5 C (99.5 F)]   Temp src:  [-]   Heart Rate:  [100]   Resp:  [18]   Height:  [170.2 cm (5\' 7" )]   Weight:  [92.534 kg (204 lb)]       Physical Exam:  HEENT: Normocephalic; atraumatic  Cardiovascular: Regular rate and rhythm with no murmurs  Respiratory: normal respiratory effort  Abdomen: Gravid non-tender  Extremities/Skin: Minimal edema of pedal and pretibial (1+)    Pelvic Exam:  1/75/-3    Sterile Speculum Exam:  Pooling: negative  Nitrazine: negative  Ferning: negative  Membranes:  intact    Cultures collected: GBS with susceptibilities (d/t PCN allergy) sent. Third trimester Gc/chlamydia sent    Wet prep:  Whiff: no     Clue cells: no      Trich: no       Yeast:  no      EFM:  Baseline: 135 bpm       Variability:  moderate    Accels: yes   Decels: none    Cat:  Category I       Toco: irregular, every 9 minutes                Labor Assessment: no labor    Labs:  No results found for this basename: WBC, HGB, HCT, PLT,  in the last 168 hours    Assessment and Plan:  27 y.o. G2P0010 at [redacted]w[redacted]d her to rule out ROM.  - NST: reactive  - no e/o ROM or vaginitis on SSE.  Vaginal discharge appears  consistent with mucus plug.  - GBS with susceptibilities (d/t PCN allergy) sent. Third trimester Gc/chlamydia sent  - Patient will f/u results of these at her routine OB visit scheduled 10/16/12, call sooner for signs/sx of active labor or ROM.    D/W Dr. Onnie Boer.  Jeanette Caprice, MD

## 2012-10-13 NOTE — Discharge Instructions (Signed)
Reason for Triage Visit: Rule out ROM  Destination: Home  Mode: Ambulatory    Procedures done in triage: Fetal monitoring, Sterile speculum exam and Cervical exam  Pending Laboratory Data: Vaginal culture (s)    Diagnosis:   Active Hospital Problems    Diagnosis   . Vaginal discharge      Resolved Hospital Problems    Diagnosis   No resolved problems to display.       Diet: Regular.  Please begin twice daily iron supplement, which has been sent to your pharmacy.    Activity: Regular activity    Special Instructions: You were evaluated at [facility] Labor and Delivery Triage to rule out rupture of your membranes - there is no sign that you have ruptured your membranes.  It does appear that you are passing your mucus plug.  Please call your primary OB/Gyn if you develop contractions, vaginal bleeding, leakage of fluid, or decreased fetal movement.

## 2012-10-13 NOTE — Progress Notes (Signed)
Discharge instructions and review precautions reviewed with pt and FOB. Pt ambulatory upon d/c  Monia Sabal, RN

## 2012-10-15 ENCOUNTER — Telehealth: Payer: Self-pay | Admitting: Obstetrics & Gynecology

## 2012-10-15 ENCOUNTER — Inpatient Hospital Stay
Admit: 2012-10-15 | Disposition: A | Discharge status: Home / Self Care | Payer: Self-pay | Source: Ambulatory Visit | Attending: Obstetrics and Gynecology | Admitting: Obstetrics and Gynecology

## 2012-10-15 ENCOUNTER — Encounter: Payer: Self-pay | Admitting: Obstetrics and Gynecology

## 2012-10-15 DIAGNOSIS — O4290 Premature rupture of membranes, unspecified as to length of time between rupture and onset of labor, unspecified weeks of gestation: Secondary | ICD-10-CM

## 2012-10-15 LAB — CBC
Hematocrit: 32 % — ABNORMAL LOW (ref 34–45)
Hemoglobin: 10.5 g/dL — ABNORMAL LOW (ref 11.2–15.7)
MCV: 85 fL (ref 79–95)
Platelets: 210 10*3/uL (ref 160–370)
RBC: 3.7 MIL/uL — ABNORMAL LOW (ref 3.9–5.2)
RDW: 14.1 % (ref 11.7–14.4)
WBC: 8 10*3/uL (ref 4.0–10.0)

## 2012-10-15 LAB — TYPE AND SCREEN
ABO RH Blood Type: O POS
Antibody Screen: NEGATIVE

## 2012-10-15 MED ORDER — LACTATED RINGERS IV SOLN *I*
150.0000 mL/h | INTRAVENOUS | Status: DC
Start: 2012-10-15 — End: 2012-10-17
  Administered 2012-10-15 – 2012-10-16 (×3): 150 mL/h via INTRAVENOUS
  Administered 2012-10-16: 75 mL/h via INTRAVENOUS
  Administered 2012-10-16 (×2): 999 mL/h via INTRAVENOUS
  Administered 2012-10-17 (×2): 150 mL/h via INTRAVENOUS

## 2012-10-15 MED ORDER — VANCOMYCIN HCL IN DEXTROSE 5 MG/ML IV SOLN *I*
1000.0000 mg | Freq: Two times a day (BID) | INTRAVENOUS | Status: DC
Start: 2012-10-15 — End: 2012-10-17
  Administered 2012-10-15 – 2012-10-17 (×4): 1000 mg via INTRAVENOUS
  Filled 2012-10-15 (×6): qty 200

## 2012-10-15 MED ORDER — OXYTOCIN 30 UNITS IN 500ML NS WRAPPED *I*
1.0000 m[IU]/min | INTRAMUSCULAR | Status: DC
Start: 2012-10-15 — End: 2012-10-17
  Administered 2012-10-15: 2 m[IU]/min via INTRAVENOUS
  Administered 2012-10-15: 4 m[IU]/min via INTRAVENOUS
  Administered 2012-10-15: 8 m[IU]/min via INTRAVENOUS
  Administered 2012-10-15: 6 m[IU]/min via INTRAVENOUS
  Administered 2012-10-16: 32 m[IU]/min via INTRAVENOUS
  Administered 2012-10-16: 28 m[IU]/min via INTRAVENOUS
  Administered 2012-10-16: 38 m[IU]/min via INTRAVENOUS
  Administered 2012-10-16: 16 m[IU]/min via INTRAVENOUS
  Administered 2012-10-16: 10 m[IU]/min via INTRAVENOUS
  Administered 2012-10-16: 26 m[IU]/min via INTRAVENOUS
  Administered 2012-10-16: 40 m[IU]/min via INTRAVENOUS
  Administered 2012-10-16 (×2): 24 m[IU]/min via INTRAVENOUS
  Administered 2012-10-16: 36 m[IU]/min via INTRAVENOUS
  Administered 2012-10-16: 18 m[IU]/min via INTRAVENOUS
  Administered 2012-10-16: 14 m[IU]/min via INTRAVENOUS
  Administered 2012-10-16: 30 m[IU]/min via INTRAVENOUS
  Administered 2012-10-16: 20 m[IU]/min via INTRAVENOUS
  Administered 2012-10-16: 34 m[IU]/min via INTRAVENOUS
  Administered 2012-10-16: 38 m[IU]/min via INTRAVENOUS
  Administered 2012-10-16: 12 m[IU]/min via INTRAVENOUS
  Administered 2012-10-17: 26 m[IU]/min via INTRAVENOUS
  Administered 2012-10-17: 14 m[IU]/min via INTRAVENOUS
  Administered 2012-10-17: 10 m[IU]/min via INTRAVENOUS
  Administered 2012-10-17: 12 m[IU]/min via INTRAVENOUS
  Administered 2012-10-17: 22 m[IU]/min via INTRAVENOUS
  Administered 2012-10-17: 4 m[IU]/min via INTRAVENOUS
  Administered 2012-10-17: 28 m[IU]/min via INTRAVENOUS
  Administered 2012-10-17: 24 m[IU]/min via INTRAVENOUS
  Administered 2012-10-17: 30 m[IU]/min via INTRAVENOUS
  Administered 2012-10-17: 20 m[IU]/min via INTRAVENOUS
  Administered 2012-10-17: 32 m[IU]/min via INTRAVENOUS
  Administered 2012-10-17: 16 m[IU]/min via INTRAVENOUS
  Administered 2012-10-17: 30 m[IU]/min via INTRAVENOUS
  Administered 2012-10-17: 18 m[IU]/min via INTRAVENOUS
  Administered 2012-10-17: 8 m[IU]/min via INTRAVENOUS
  Administered 2012-10-17: 2 m[IU]/min via INTRAVENOUS
  Administered 2012-10-17: 6 m[IU]/min via INTRAVENOUS

## 2012-10-15 NOTE — Progress Notes (Signed)
2149 Pitocin induction started following SROM.  Verbalizes understanding and consent of plan of care.

## 2012-10-15 NOTE — Progress Notes (Signed)
OB Intrapartum Note:    Subjective   Doing well, comfortable but feeling some contractions     Vitals   Filed Vitals:    10/15/12 2255   BP: 123/71   Pulse: 96   Temp: 37 C (98.6 F)   Resp: 18   Height:    Weight:        Pelvic Exam  Exam deferred at this time, last exam was 1/75/-3    Membranes: ruptured, PROM around 10am    Electronic Fetal Monitoring  Most recently  Baseline  140 bpm         Variability: Moderate (6-25 BPM)  Pattern: Accelerations  Toco: irregular, every 5-7 minutes      Assessment & Plan   27 y.o. G2P0010 at [redacted]w[redacted]d admitted for PROM     FHT: Category I   Pain: none   GBS: positive with penicillin allergy and no susceptibilities so getting Vancomycin   Risks: Migraines, GBS+   Labor: not in labor, SVD anticipated    Zelphia Cairo M.D.  Ob/Gyn R1  Pager: [phone]  10/15/2012 11:44 PM

## 2012-10-15 NOTE — Progress Notes (Signed)
Report from Amadeo Garnet, RN. Care assumed of pt.

## 2012-10-15 NOTE — Telephone Encounter (Signed)
Patient called answering service stating that she was seen for LOF earlier today. Was ruled out but thinks she's leaking more fluid now. Denies any vaginal bleeding. Reports some cramping. +FM. Recommended triage evaluation for r/o ROM. Patient amenable. Triage notified.    Corinne Ports, MD  Ob/Gyn R2  Pager #: [phone]

## 2012-10-15 NOTE — Progress Notes (Signed)
Patient transferred to 09-1623 for expectant management. Vancomycin started for GBS +. Report given to Alinda Money RN.

## 2012-10-15 NOTE — Progress Notes (Signed)
Pt transferred from triage ambulatory accompanied by fob and friend. Report received from S.Gurnett RN. IV infusing Vanco./LR. Pt orientated to room.  Call bell in reach.  Pt declining starting pitocin at this time.  Pt wishes for her mother to be her.  Her mother is on her way from [place].

## 2012-10-15 NOTE — Progress Notes (Signed)
1859- ok for pt to come off monitor per Dr.B.Paivanas.  Pt states she is hungry.  Will allow pt to eat dinner and then start pitocin per Dr.B.Paivanas. Pt given menu.

## 2012-10-15 NOTE — H&P (Addendum)
OB Admission H&P    CC: LOF    HPI: 27 y.o. G2P0010 at [redacted]w[redacted]d by LMP presents with LOF at 10AM first followed by several other gushes throughout the day. Denies any VB. Denies any contractions. +FM.     Pregnancy Risks:  H/o first trimester bleeding, resolved  Migraines-no meds, saw a Neurologist in [place] but hasn't seen one here  H/o abnormal pap smear with negative in 04/13/12  PCN allergy-swelling  GBS positive    Obstetrical History:  OB History    Grav Para Term Preterm Abortions TAB SAB Ect Mult Living    2 0 0 0 1 0 1 0 0 0        # Outc Date GA Lbr Len/2nd Wgt Sex Del Anes PTL Lv    1 SAB 2011            2 CUR                   Past Medical History:  Past Medical History   Diagnosis Date   . Anemia    . Migraines    . Abnormal Pap smear      last negative 04/2012        Past Surgical History:  History reviewed. No pertinent past surgical history.     Home Medications:  Prior to Admission medications    Medication Sig Start Date End Date Taking? Authorizing Provider   ferrous sulfate 325 (65 FE) MG tablet Take 1 tablet (325 mg total) by mouth 2 times daily (with meals) 10/13/12 11/12/12 Yes Locher, Megan, MD   Prenatal Vit-Fe Fumarate-FA (PRENATAL VITAMINS PLUS) 27-1 MG TABS Take 1 tablet by mouth daily 03/23/12  Yes Shoots, Harriet Butte, NP   relief maternity garment Use as directed 07/11/12   Fluellen, Almetta Lovely, NP        Allergies:  Allergies   Allergen Reactions   . Pcn (Penicillins) Swelling        Social History:  Patient reports that she has never smoked. She has never used smokeless tobacco. She reports that she currently engages in sexual activity. She reports using the following method of birth control/protection: None. She reports that she does not drink alcohol or use illicit drugs.      Family History:  Family History   Problem Relation Age of Onset   . Hypertension Mother    . Diabetes Maternal Aunt    . Diabetes Paternal Aunt         Gynecologic History:  STIs:  Denies    Review of Systems:   negative  All  other ROS negative unless otherwise noted in HPI.     Pertinent Prenatal Ultrasounds (See Imaging Tab):  06/16/12: [redacted]w[redacted]d Anterior mid placenta. No gross anatomic defects.  04/04/12: [redacted]w[redacted]d SIUP with CA.    Prenatal Labs:    Lab Results   Component Value Date/Time    ABORH O RH POS 03/23/2012  2:20 PM    RUB POSITIVE 03/23/2012  2:20 PM    SYPG Neg 03/23/2012  2:20 PM    HIV12 Nonreactive 03/23/2012  2:20 PM    AUS NEG 03/23/2012  2:20 PM    PDG . 03/23/2012  2:52 PM    PDC . 03/23/2012  2:52 PM    1SCRN 91 08/11/2012  1:33 PM       Physical Exam:  Filed Vitals:    10/15/12 1700   BP:    Pulse: 100   Temp: 37.2 C (  99 F)   Resp:    Height:    Weight:        General: Appears stated age, well appearing and in NAD  HEENT: Normocephalic, atraumatic  Respiratory: Normal respiratory effort  Abdomen: Soft, gravid, non-tender no fundal tenderness  Extremities/Skin: No edema noted    Pelvic Exam:  1/75/-3    Sterile Speculum Exam:   Pooling: positive  Nitrazine: positive  Ferning: positive  Membranes: ruptured      Estimated Fetal Weight: 3700 grams by leopolds    Presentation:  cephalic by ultrasound    Fetal Monitoring:  Baseline: 140 bpm          Variability: Moderate (6-25 BPM)  Pattern: Accelerations  Category: Category I  Toco: irregular      Assessment/Plan:  27 y.o. G2P0010 at [redacted]w[redacted]d with PROM  - Admit to 09-1398, Dr. Solon Palm  - T/S, syphilis screen and CBC sent  - Insert Hep lock  - Rh positive/ Rubella immune  - GBS positive  - Cervical exam: 1/75/-3 / Membranes ruptured  - Presentation cephalic / EFW 3700g  - Pregnancy Risks:   o H/o first trimester bleeding, resolved  o Migraines-no meds, saw a Neurologist in [place] but hasn't seen one here  o H/o abnormal pap smear with negative in 04/13/12  o PCN allergy-swelling  o GBS positive-starting on Vancomycin due to PCN allergy and no susceptibilities  - Expecting a baby boy/ breast feeding, undecided on PPBC  - Patient desires waiting for her mother to arrive prior to starting  pitocin. Mother lives 4-5 hours away and is just leaving. Discussed in detail the risks of prolonged ROM including chorioamnionitis, infant infection, NICU stay with antibiotics, etc. Patient would like to discuss pitocin delaying further with attending. Would also like epidural as soon as possible but denies being uncomfortable at this time.    D/w Dr.Kearia Yin    Helen Ports, MD  Ob/Gyn R2  Pager #: [phone]    R4 Addendum:   27 yo G2P0010 at [redacted]w[redacted]d by LMP c/w [redacted]w[redacted]d USN with PROM. Ok with starting pitocin at 7pm.     Risks:  GBS pos -sensitivities pending  PCN allergy  Migraines     A/P: 27 yo G2P0010 at [redacted]w[redacted]d with PROM  - will start pitocin at 7pm  - anticipates baby boy, does not want a circ  - PP BC: we discussed options, with a focus on LARC. She is considering a MIUD.  - Tdap postpartum    Helen Gleason, MD  Ob/Gyn R4  Pager [phone]    Attending:  Patient seen and evaluated with residents.  I agree with the above assessment and plan of care.   Charlestine Night, MD  Maternal-Fetal Medicine

## 2012-10-15 NOTE — Progress Notes (Signed)
OB Intrapartum Note:    Subjective   Doing well, comfortable    Vitals   Filed Vitals:    10/15/12 1859   BP: 120/76   Pulse: 84   Temp:    Resp: 18   Height:    Weight:        Pelvic Exam  Exam deferred at this time, last exam was 1/75/-3    Membranes: ruptured, PROM around 10am    Electronic Fetal Monitoring  Most recently  Baseline  140 bpm         Variability: Moderate (6-25 BPM)  Pattern: Accelerations  Toco: irregular, every 5-9 minutes      Assessment & Plan   27 y.o. G2P0010 at [redacted]w[redacted]d admitted for PROM     FHT: Category I   Pain: none   GBS: positive with penicillin allergy and no susceptibilities so getting Vancomycin   Risks: Migraines, GBS+   Labor: not in labor, SVD anticipated    Zelphia Cairo M.D.  Ob/Gyn R1  Pager: [phone]  10/15/2012 7:33 PM

## 2012-10-15 NOTE — Progress Notes (Signed)
OB Intrapartum Note:    Subjective: Resting.  Did not disturb.    Vitals:   Filed Vitals:    10/15/12 1859   BP: 120/76   Pulse: 84   Temp:    Resp: 18   Height:    Weight:        Pelvic Exam:  Deferred.  Last exam at 5 pm (1/75/-3)    Membranes: ruptured, clear fluid (SROM, 10 am)    Electronic Fetal Monitoring:  Baseline  140 bpm         Variability: Moderate (6-25 BPM)  Pattern: Accelerations  Category: Category I  Toco: none  Labor Assessment: Early Labor    A/P:  27 y.o. G2P0010 at [redacted]w[redacted]d admitted for PROM.    1) FHT: Cat I, EFM  2) Pain: Comfortable  3) GBS: positive, on Vanco, s/p 1 dose  -PCN allergy without sensitivities  4) Labor:   -Early Labor; Expectant management  -Previously declined Pitocin until family members arrived.    Judson Roch, MD  OB/Gyn R2  *[phone]

## 2012-10-16 ENCOUNTER — Encounter: Payer: Self-pay | Admitting: Reproductive Endocrinology and Infertility

## 2012-10-16 ENCOUNTER — Encounter: Payer: Self-pay | Admitting: Obstetrics and Gynecology

## 2012-10-16 LAB — CHLAMYDIA PLASMID DNA AMPLIFICATION: Chlamydia Plasmid DNA Amplification: 0

## 2012-10-16 LAB — SYPHILIS SCREEN
Syphilis Screen: NEGATIVE
Syphilis Status: NONREACTIVE

## 2012-10-16 LAB — N. GONORRHOEAE DNA AMPLIFICATION: N. gonorrhoeae DNA Amplification: 0

## 2012-10-16 MED ORDER — FENTANYL CITRATE 50 MCG/ML IJ SOLN *WRAPPED*
INTRAMUSCULAR | Status: AC
Start: 2012-10-16 — End: 2012-10-17
  Filled 2012-10-16: qty 2

## 2012-10-16 MED ORDER — FENTANYL 2 MCG/ML AND 0.0625% BUPIVACAINE IN 250 ML NS *I*
INTRAMUSCULAR | Status: AC
Start: 2012-10-16 — End: 2012-10-17
  Filled 2012-10-16: qty 250

## 2012-10-16 MED ORDER — MORPHINE SULFATE 15 MG/ML IJ SOLN *I*
15.0000 mg | Freq: Once | INTRAMUSCULAR | Status: AC
Start: 2012-10-16 — End: 2012-10-16
  Administered 2012-10-16: 15 mg via INTRAMUSCULAR
  Filled 2012-10-16: qty 1

## 2012-10-16 MED ORDER — PROMETHAZINE HCL 25 MG/ML IJ SOLN *I*
25.0000 mg | Freq: Once | INTRAMUSCULAR | Status: AC
Start: 2012-10-16 — End: 2012-10-16
  Administered 2012-10-16: 25 mg via INTRAMUSCULAR
  Filled 2012-10-16: qty 1

## 2012-10-16 MED ORDER — NALBUPHINE HCL 10 MG/ML IJ SOLN *I*
3.0000 mg | Freq: Once | INTRAMUSCULAR | Status: AC
Start: 2012-10-16 — End: 2012-10-16
  Administered 2012-10-16: 3 mg via INTRAVENOUS
  Filled 2012-10-16: qty 3

## 2012-10-16 NOTE — Progress Notes (Signed)
Obstetric Intrapartum Note    Subjective: Patient is resting upon my arrival, some relief from morphine and phenergan.     Vitals:   Filed Vitals:    10/16/12 0915 10/16/12 0945 10/16/12 1012 10/16/12 1047   BP:  121/71  130/75   Pulse: 78 78 80 81   Temp:    37.1 C (98.8 F)   TempSrc:    Temporal   Resp: 18 18 18 18    Height:       Weight:         AFVSS    Electronic Fetal Monitoring:  Baseline  140 bpm         Variability: Moderate (6-25 BPM), low moderate s/p morphine   Pattern: Accelerations  Category: Category I  Toco: irregular, every 2-4 minutes  Labor Assessment: PROM    A/P:  27 y.o. G2P0010 at [redacted]w[redacted]d admitted with PROM    1) FHT: Category I, continue CEFM with augmentation  2) Pain: resting s/p morphine and phenergan   3) GBS: positive, on Vancomycin given no susceptibilities collected.   4) Induction: transcervical foley placed to gravity at 08:30. Will periodically assess for spontaneous extrusion.   5) Labor: pitocin started at 21:49 on 4/13 without significant cervical change over 10 hours. Pitocin now at 24, continue to advance per protocol as tolerated.     Lillion Elbert J. Sharion Balloon, M.D.  Ob-Gyn Resident R1  Pager [phone]  10/16/2012 11:45 AM

## 2012-10-16 NOTE — Progress Notes (Addendum)
Obstetric Intrapartum Note    Subjective: Patient is comfortable, starting to feel contractions more.    Vitals:   Filed Vitals:    10/16/12 0430 10/16/12 0500 10/16/12 0557 10/16/12 0708   BP:  116/66 138/85 132/81   Pulse: 84 78 75 79   Temp:  37 C (98.6 F)  37 C (98.6 F)   TempSrc:  Temporal  Temporal   Resp:  18  18   Height:       Weight:         AFVSS    Pelvic Exam:   Dilation: 1   Effacement: 75   Station: -3    Membranes: SROM for clear fluid at 10:00 4/13    **Transcervical foley placed to tension with 30 cc fluid**    Electronic Fetal Monitoring:  Baseline  140 bpm         Variability: Moderate (6-25 BPM)  Pattern: Accelerations  Category: Category I  Toco: irregular, every 2-3 minutes  Labor Assessment: PROM    A/P:  27 y.o. G2P0010 at [redacted]w[redacted]d admitted with PROM    1) FHT: Category I, continue CEFM with augmentation  2) Pain: comfortable at present. Ok for M&P if desired.   3) GBS: positive, on Vancomycin given no susceptibilities collected.   4) Induction: transcervical foley placed to gravity at 08:30. Will periodically assess for spontaneous extrusion.   5) Labor: pitocin started at 21:49 on 4/13 without significant cervical change over 10 hours. Pitocin now at 24, continue to advance per protocol as tolerated.     Stefanie J. Sharion Balloon, M.D.  Ob-Gyn Resident R1  Pager [phone]  10/16/2012 8:47 AM      I saw and evaluated the patient. I agree with the resident's/fellow's findings and plan of care as documented above.  Patient is moderately uncomfortable with the contractions.  Plan for morphine and phenergan as she is remote from delivery.    Purvis Kilts, MD

## 2012-10-16 NOTE — Progress Notes (Signed)
Obstetric Intrapartum Note    Subjective: Patient is resting upon my arrival, some relief from morphine and phenergan.     Vitals:   Filed Vitals:    10/16/12 1110 10/16/12 1144 10/16/12 1243 10/16/12 1346   BP:  138/86 127/85 117/71   Pulse: 76 73 75 72   Temp:       TempSrc:       Resp: 18 18     Height:       Weight:         AFVSS    Electronic Fetal Monitoring:  Baseline  140 bpm         Variability: Moderate (6-25 BPM)  Pattern: Accelerations  Category: Category I  Toco: irregular, every 2-6 minutes  Labor Assessment: PROM    A/P:  27 y.o. G2P0010 at [redacted]w[redacted]d admitted with PROM    1) FHT: Category I, continue CEFM with augmentation  2) Pain: resting s/p morphine and phenergan   3) GBS: positive, on Vancomycin given no susceptibilities collected.   4) Induction: transcervical foley placed to gravity at 08:30. Will periodically assess for spontaneous extrusion.   5) Labor: pitocin started at 21:49 on 4/13 without significant cervical change over 10 hours. Pitocin now at 28, continue to advance per protocol as tolerated.     Jaquay Posthumus J. Sharion Balloon, M.D.  Ob-Gyn Resident R1  Pager [phone]  10/16/2012 2:15 PM

## 2012-10-16 NOTE — Progress Notes (Signed)
OB Intrapartum Note:    Subjective   Doing well, comfortable, resting, did not disturb     Vitals   Filed Vitals:    10/15/12 2349   BP: 135/75   Pulse: 93   Temp:    Resp:    Height:    Weight:        Pelvic Exam  Exam deferred at this time, last exam was 1/75/-3    Membranes: ruptured, PROM around 10am on 4/13    Electronic Fetal Monitoring  Baseline  140 bpm         Variability: Moderate (6-25 BPM)  Pattern: Accelerations  Toco: irregular, every 3-7 minutes      Assessment & Plan   27 y.o. G2P0010 at [redacted]w[redacted]d admitted for PROM     FHT: Category I   Pain: none   GBS: positive with penicillin allergy and no susceptibilities, therefore treated with Vancomycin for GBS suppression    Risks: Migraines, GBS+   Labor: not in labor, SVD anticipated    Zelphia Cairo M.D.  Ob/Gyn R1  Pager: [phone]  10/16/2012 1:23 AM

## 2012-10-16 NOTE — Progress Notes (Addendum)
OB Intrapartum Note:    Subjective   Feeling contractions, worsening pain   Vitals   Filed Vitals:    10/16/12 1715   BP:    Pulse: 80   Temp:    Resp: 18   Height:    Weight:        Pelvic Exam  Dilation: 5cm  Effacement: 100%  Station: -1    Foley bulb removed, was wedged between vertex and pubic bone    Membranes: ruptured at 10am on 4/13    Electronic Fetal Monitoring  Baseline  135 bpm         Variability: Moderate (6-25 BPM)  Pattern: No accelerations or decelerations   Toco: irregular, every 2-5 minutes      Assessment & Plan   27 y.o. G2P0010 at [redacted]w[redacted]d admitted PROM, now about 32 hours ago     FHT: Category I   Pain: worsening, planning to get epidural eventually,   GBS: positive - on vancomycin given no susceptibilities collected.   Risks: prolonged rupture, h/o migraine    Labor: 1st Stage; latent-to-active phase, SVD anticipated   Continue pitocin    D/w Dr. Deliah Goody and Dr. Gemma Payor M.D.  Ob/Gyn R1  Pager: [phone]  10/16/2012 6:18 PM    Ob/Gyn Attending:  In to meet patient. Patient is a 27yo G2P0010 at [redacted]w[redacted]d with PROM. S/p pitocin overnight with foley this AM. Foley removed, now 5 cm. Continue pitocin. Epidural as desired. Anticipate SVD.    Delanna Notice, DO

## 2012-10-16 NOTE — Progress Notes (Signed)
Obstetric Intrapartum Note    Subjective: Patient feeling contractions more now but generally able to find positions of comfort.     Vitals:   Filed Vitals:    10/16/12 1415 10/16/12 1445 10/16/12 1545 10/16/12 1647   BP:  127/77 129/88 136/86   Pulse: 74 76 71 81   Temp:  36.8 C (98.2 F)     TempSrc:  Temporal     Resp: 16 16 18     Height:       Weight:         AFVSS    Electronic Fetal Monitoring:  Baseline  140 bpm         Variability: Moderate (6-25 BPM)  Pattern: Accelerations  Category: Category I  Toco: irregular, every 2-6 minutes  Labor Assessment: PROM    A/P:  27 y.o. G2P0010 at [redacted]w[redacted]d admitted with PROM    1) FHT: Category I, continue CEFM with augmentation  2) Pain: resting s/p morphine and phenergan   3) GBS: positive, on Vancomycin given no susceptibilities collected.   4) Induction: transcervical foley placed to gravity at 08:30. Will periodically assess for spontaneous extrusion.   5) Labor: pitocin started at 21:49 on 4/13 without significant cervical change over 10 hours at which time transcervical foley was placed. Pitocin now at 34, may consider washout period with resumption of pitocin versus ripening overnight.     Romana Deaton J. Sharion Balloon, M.D.  Ob-Gyn Resident R1  Pager [phone]  10/16/2012 5:12 PM

## 2012-10-16 NOTE — Progress Notes (Signed)
Report received and care assumed by Clinical research associate. Assessment completed and all wnl. Pt comfortable and c/o cramping in lower abdomen. Call light within reach, bed in the locked and low position. Family at bedside. Lights turned off per pt request. Encouraged to rest in between contractions. Oxytocin infusing at this time at 56milli-units/min. LR turned down to 67ml/hr at this time; pt stating that she has been getting up to use the bathroom a lot and wanted to know if there was some other way to void without getting out of bed. Writer encouraged pt that getting out of bed and moving is a good thing, but Clinical research associate would turn maintenance fluids down. Dr Sharion Balloon r1 notified.  Rod Holler, RN

## 2012-10-16 NOTE — Progress Notes (Signed)
OB Intrapartum Note:    Subjective   More comfortable with epidural, hungry, tired and itchy     Vitals   Filed Vitals:    10/16/12 2015   BP:    Pulse: 76   Temp:    Resp:    Height:    Weight:        Pelvic Exam  Exam unchanged:  Dilation: 5cm  Effacement: 100%  Station: -1    Membranes: ruptured at 10am on 4/13    Electronic Fetal Monitoring  Baseline  135 bpm         Variability: Moderate (6-25 BPM)  Pattern: Early deceleration, Acceleration with scalp stimulation  Toco: irregular, every 1-5 minutes    Assessment & Plan   27 y.o. G2P0010 at [redacted]w[redacted]d admitted PROM, now about 32 hours ago     FHT: Category I   Pain: worsening, planning to get epidural eventually,   GBS: positive - on vancomycin given no susceptibilities collected.   Risks: prolonged rupture, h/o migraine    Labor: 1st Stage; latent-to-active phase, SVD anticipated   Continue pitocin, now at 38, plan in increase further up to max of 42    Zelphia Cairo M.D.  Ob/Gyn R1  Pager: [phone]  10/16/2012 8:40 PM

## 2012-10-16 NOTE — Progress Notes (Signed)
Cervical foley placed at this time by dr Sharion Balloon r1.

## 2012-10-16 NOTE — Student Note (Addendum)
OB/GYN Student (CC3) Intrapartum Note   LOS: 1 day  Full Code    Subjective:       Pt is doing well.  Foley bulb removed. Epidural placed by anesthesia and has not been feeling contractions.  Complains of itching, but otherwise comfortable.     Objective:        Filed Vitals:    10/16/12 1936 10/16/12 1946 10/16/12 2000 10/16/12 2015   BP: 129/84 124/77 125/76    Pulse: 81 71 75 76   Temp:       TempSrc:       Resp:  18 18    Height:       Weight:       SpO2:           Pelvic Exam:   Dilation: 5cm   Effacement: 100%   Station: -1    Membranes: rupture at 1000 on 4/13    Fetal Monitoring:   Baseline HR: 140  Variability: moderate  Pattern: accelerations  Tocolysis: irregular every 3-5 minutes    Assessment:   27 y.o. female G2P0010 at [redacted]w[redacted]d presents with PROM    Plans:   1. FHT Category: I  2. Pain Control: epidural placed by anesthesia  3. GBS: + on vancomycin  4. Labor: pitocin running since 2200 on 4/13 with minimal change, now at 34, considering PO misoprostol for cervical ripening if no change continues                                                             Author: Karel Jarvis on 10/16/2012 at 8:43 PM  ** This is a Psychologist, occupational note and subject to verification by the resident team; it is not part of the patient's permanent medical record.    Attestation of Provider Supervising Student/Trainee  I have reviewed the above student/trainee note for the purpose of feedback and education.  For details of patient evaluation and management, please see the provider documentation from the same date of service.  Zelphia Cairo, MD  10/16/2012  11:05 PM

## 2012-10-16 NOTE — Progress Notes (Signed)
OB Intrapartum Note:    Subjective   Doing well, comfortable, resting, did not disturb     Vitals   Filed Vitals:    10/16/12 0430   BP:    Pulse: 84   Temp:    Resp:    Height:    Weight:        Pelvic Exam  Exam deferred at this time, last exam was 1/75/-3    Membranes: ruptured, PROM around 10am on 4/13    Electronic Fetal Monitoring  Baseline  135 bpm         Variability: Moderate (6-25 BPM)  Pattern: Accelerations  Toco: irregular, every 2-7 minutes    Assessment & Plan   27 y.o. G2P0010 at [redacted]w[redacted]d admitted for PROM     FHT: Category I   Pain: none   GBS: positive with penicillin allergy and no susceptibilities, therefore treated with Vancomycin for GBS suppression    Risks: Migraines, GBS+   Labor: not in labor, SVD anticipated    Zelphia Cairo M.D.  Ob/Gyn R1  Pager: [phone]  10/16/2012 5:06 AM

## 2012-10-16 NOTE — Progress Notes (Signed)
OB Intrapartum Note:    Subjective   Resting, comfortable with epidural, feeling some pressure    Vitals   Filed Vitals:    10/16/12 2307   BP: 147/78   Pulse: 77   Temp:    Resp: 18   Height:    Weight:        Pelvic Exam  Minimal change from prior exam, cervix noted to be somewhat swollen.  Dilation: 6cm  Effacement: 90%  Station: -1    Membranes: ruptured at 10am on 4/13    Electronic Fetal Monitoring  Baseline  140 bpm         Variability: Moderate (6-25 BPM)  Pattern: Early deceleration, Acceleration  Toco: irregular, every 2-4 minutes    Assessment & Plan   27 y.o. G2P0010 at [redacted]w[redacted]d admitted PROM, now over 36 hours ago     FHT: Category I   Pain: worsening, planning to get epidural eventually,   GBS: positive - on vancomycin given no susceptibilities collected.   Risks: prolonged rupture, h/o migraine    Labor: 1st Stage; latent-to-active phase, SVD anticipated   Will hold pitocin for now for washout and allow patient to eat, plan to restart pitocin in 1-2 hours.     Zelphia Cairo M.D.  Ob/Gyn R1  Pager: [phone]  10/16/2012 11:27 PM

## 2012-10-16 NOTE — Progress Notes (Signed)
Report to Trisha Gammariello, RN

## 2012-10-16 NOTE — Anesthesia Pre-procedure Eval (Signed)
Anesthesia Pre-operative Evaluation for [facility] [place]    [facility] [place], 27 y.o. female G2P0010 at [redacted]w[redacted]d admitted for PROM with PMHx significant for anemia, migraine, and scoliosis, requesting an epidural.    Health History  Past Medical History   Diagnosis Date   . Anemia    . Migraines    . Abnormal Pap smear      last negative 04/2012   . Scoliosis      Past Surgical History   Procedure Laterality Date   . No hx of anes complic       as of 10/16/2012   . No fam hx of anes complic       as of 10/16/2012     Social History  History   Substance Use Topics   . Smoking status: Never Smoker    . Smokeless tobacco: Never Used   . Alcohol Use: No      History   Drug Use No     ______________________________________________________________________  Allergies:   Allergies   Allergen Reactions   . Pcn (Penicillins) Swelling     Prior to Admission Medications    Last Medication Reconciliation Action:  Up-To-Date Corinne Ports, MD 10/15/2012  4:54 PM              Last Dose Start Date End Date Provider     Prenatal Vit-Fe Fumarate-FA (PRENATAL VITAMINS PLUS) 27-1 MG TABS   03/23/12  --  Shoots, Harriet Butte, NP     Take 1 tablet by mouth daily     ferrous sulfate 325 (65 FE) MG tablet   10/13/12  11/12/12  Jeanette Caprice, MD     Take 1 tablet (325 mg total) by mouth 2 times daily (with meals)     relief maternity garment   07/11/12  --  Gloriann Loan, NP     Use as directed        Current Facility-Administered Medications   Medication   . fentaNYL (SUBLIMAZE) 0.05 MG/ML injection   . FentaNYL-Bupivacaine-NaCl 2-0.0625-0.9 MCG/ML-%-% drip   . Lactated Ringers Infusion   . Vancomycin (VANCOCIN) IV 1,000 mg   . Oxytocin 30 units/500 mL (PITOCIN) infusion SOLN     Admission Medications:  Scheduled Meds  . fentaNYL     . FentaNYL-Bupivacaine-NaCl     . Vancomycin 1,000 mg at 10/16/12 1805   IV Meds  . lactated ringers 75 mL/hr (10/16/12 1350)   . Oxytocin 30 units/500 mL 36 milli-units/min (10/16/12 1700)   PRN Meds    Anesthesia EvaluationInformation Source: per patient, per records  General  Pertinent (-):  history of anesthetic complications, FamHx of anesthetic complications, obesity or substance abuse    HEENT  Pertinent (-):   Corrective Eyewear or neck pain    Pulmonary   Pertinent (-):  smoking, asthma, shortness of breath, recent URI, cough/congestion or environmental allergies Cardiovascular  Good(4+METs) Exercise Tolerance  Pertinent (-):  hypertension, DOE or DVT    GI/Hepatic/Renal  Last PO Intake: >8hr before procedure    Pertinent (-):  GERD, nausea, vomiting, alcohol use, liver  issues or renal issues Neuro/Psych    + Headaches            migraines    + Chronic pain (hx of scoliosis, no numbness/tingling. no surgical correction.)          lower back  Pertinent (-):  syncope, seizures, cerebrovascular event or neuro deficit    Endo/Other  Pertinent (-):  diabetes mellitus or thyroid  disease    Hematologic  Pertinent (-):  bruises/bleeds easily     Nursing Reported PO Status:           ______________________________________________________________________  Physical Exam    Airway            Mouth opening: normal            Mallampati: III            TM distance (fb): >3 FB            Neck ROM: full  Dental   Normal Exam   Cardiovascular           Rhythm: regular           Rate: normal  No murmur    General Survey    No rashes   Pulmonary     breath sounds clear to auscultation    Mental Status     oriented to person, place and time    + Anxious    depressed    Not confused, depressed, agitated         Most Recent Vitals: BP: 125/75 mmHg (10/16/12 1918)  Heart Rate: 83 (10/16/12 1918)  Temp: 36.4 C (97.5 F) (10/16/12 1647)  Resp: 18 (10/16/12 1715)  Height: 170.2 cm (5\' 7" ) (10/15/12 1649)  Weight: 92.534 kg (204 lb) (10/15/12 1649)  BMI (Calculated): 32 (10/15/12 1649)  SpO2: 99 % (10/16/12 1906)    Vital Sign Ranges (last 24hrs)  Temp:  [36.4 C (97.5 F)-37.1 C (98.8 F)] 36.4 C (97.5 F)  Heart Rate:   [70-100] 83  Resp:  [16-18] 18  BP: (116-153)/(66-89) 125/75 mmHg   O2 Device: None (Room air) (10/16/12 0730)    Most Recent Lab Results   Blood Type  Lab Results   Component Value Date    ABORH O RH POS 10/15/2012    ABS Negative 10/15/2012   CBC  Lab Results   Component Value Date    WBC 8.0 10/15/2012    HCT 32* 10/15/2012    PLT 210 10/15/2012   Chem-7  No results found for this basename: NA,  K,  WBK,  CL,  CO2,  UN,  CREAT,  GLU,  PGLU   CrCl is unknown because no creatinine reading has been taken.  Electrolytes  No results found for this basename: CA,  MG,  PO4   Coags  No results found for this basename: PTI,  INR,  PTT   LFTs  No results found for this basename: AST,  ALT,  ALK     Bilirubin,Ur   Date Value Range Status   09/18/2012 Negative  Negative Final     Pregnancy Status: Pregnant [4]  Patient's last menstrual period was 01/27/2012.    Lab Results   Component Value Date    PUPT Positive* 03/23/2012    HCG1 91015 05/09/2012     ECG Results  No results found for this basename: rate,  PR,  statement     ANES CPM    Radiology: Complete results  No results found.    ________________________________________________________________________  Medical Problems  Patient Active Problem List    Diagnosis Date Noted   . Supervision of normal first pregnancy 03/23/2012     Priority: High     Rh Positive/Rubella immune  -first trimester screen: Unable to obtain nuchal translucency due to fetal position. Recommend second trimester screening  Normal QUAD screen  -Anatomic scan: completed; anterior placenta  -1hr GTT: 91  Gender: SURPRISE!  Breast/bottlefeed    Refused flu vaccine  <> Tdap     . PROM (premature rupture of membranes) 10/15/2012   . Vaginal discharge 10/13/2012   . Scoliosis 07/11/2012     patient reported     . History of migraine headaches 04/13/2012     Was followed by neurology in [place].  [place] not establish Neuro care here in [place].  Reports has not had any migraine headaches since her pregnancy began      . Vaginal bleeding in pregnancy 03/23/2012     USN for viability: IUP at 9.5 weks on 10/1  -Type and screen: O+  No further complaints of VB in early pregnancy         PreOp/PreProcedure Diagnosis (For more detail see procedural consent)            Labor pain  Planned Procedure (For more detail see procedural consent)            Epidural  Plan   ASA Score 2  Anesthetic Plan (epidural); Induction (routine IV); Airway (- LMA as backup); Line ( use current access); Monitoring (- O2 monitor); Positioning ( - Sitting); Pain (per surgical team)    Informed Consent     Risks:          Risks discussed were commensurate with the plan listed above with the following specific points:  failed block, headache, hypotension, infection , damage to:(nerves, blood vessels), unexpected serious injury, allergic Rx    Anesthetic Consent:         Anesthetic plan and risks discussed with:  patient    Plan discussed with:  surgeon and resident      Attending Attestation: The patient or proxy understand and accept the risks and benefits of the anesthesia plan. By accepting this note, I attest that I have personally performed the history and physical exam and prescribed the anesthetic plan within 48 hours prior to the anesthetic as documented by me above.    Author: Ilona Sorrel, MD

## 2012-10-16 NOTE — Anesthesia Pre-procedure Eval (Signed)
Anesthesia Pre-operative Evaluation for [facility]    [facility] Jeancharles, 27 y.o. female G2P0010 at [redacted]w[redacted]d admitted for PROM with PMHx significant for anemia, migraine, and scoliosis, requesting an epidural.    Health History  Past Medical History   Diagnosis Date   . Anemia    . Migraines    . Abnormal Pap smear      last negative 04/2012   . Scoliosis      Past Surgical History   Procedure Laterality Date   . No hx of anes complic       as of 10/16/2012   . No fam hx of anes complic       as of 10/16/2012     Social History  History   Substance Use Topics   . Smoking status: Never Smoker    . Smokeless tobacco: Never Used   . Alcohol Use: No      History   Drug Use No     ______________________________________________________________________  Allergies:   Allergies   Allergen Reactions   . Pcn (Penicillins) Swelling     Prior to Admission Medications    Last Medication Reconciliation Action:  Up-To-Date Corinne Ports, MD 10/15/2012  4:54 PM              Last Dose Start Date End Date Provider     Prenatal Vit-Fe Fumarate-FA (PRENATAL VITAMINS PLUS) 27-1 MG TABS   03/23/12  --  Shoots, Harriet Butte, NP     Take 1 tablet by mouth daily     ferrous sulfate 325 (65 FE) MG tablet   10/13/12  11/12/12  Jeanette Caprice, MD     Take 1 tablet (325 mg total) by mouth 2 times daily (with meals)     relief maternity garment   07/11/12  --  Gloriann Loan, NP     Use as directed        Current Facility-Administered Medications   Medication   . Lactated Ringers Infusion   . Vancomycin (VANCOCIN) IV 1,000 mg   . Oxytocin 30 units/500 mL (PITOCIN) infusion SOLN     Admission Medications:  Scheduled Meds  . Vancomycin 1,000 mg at 10/16/12 1805   IV Meds  . lactated ringers 75 mL/hr (10/16/12 1350)   . Oxytocin 30 units/500 mL 36 milli-units/min (10/16/12 1700)   PRN Meds   Anesthesia EvaluationInformation Source: per patient, per records  General  Pertinent (-):  history of anesthetic complications, FamHx of anesthetic  complications, obesity or substance abuse    HEENT  Pertinent (-):   Corrective Eyewear or neck pain    Pulmonary   Pertinent (-):  smoking, asthma, shortness of breath, recent URI, cough/congestion or environmental allergies Cardiovascular  Good(4+METs) Exercise Tolerance  Pertinent (-):  hypertension, DOE or DVT    GI/Hepatic/Renal  Last PO Intake: >8hr before procedure    Pertinent (-):  GERD, nausea, vomiting, alcohol use, liver  issues or renal issues Neuro/Psych    + Headaches            migraines    + Chronic pain (hx of scoliosis, no numbness/tingling. no surgical correction.)          lower back  Pertinent (-):  syncope, seizures, cerebrovascular event or neuro deficit    Endo/Other  Pertinent (-):  diabetes mellitus or thyroid disease    Hematologic  Pertinent (-):  bruises/bleeds easily     Nursing Reported PO Status:  ______________________________________________________________________  Physical Exam    Airway            Mouth opening: normal            Mallampati: III            TM distance (fb): >3 FB            Neck ROM: full  Dental   Normal Exam   Cardiovascular           Rhythm: regular           Rate: normal  No murmur    General Survey    No rashes   Pulmonary     breath sounds clear to auscultation    Mental Status     oriented to person, place and time    + Anxious    depressed    Not confused, depressed, agitated         Most Recent Vitals: BP: 136/86 mmHg (10/16/12 1647)  Heart Rate: 80 (10/16/12 1715)  Temp: 36.4 C (97.5 F) (10/16/12 1647)  Resp: 18 (10/16/12 1715)  Height: 170.2 cm (5\' 7" ) (10/15/12 1649)  Weight: 92.534 kg (204 lb) (10/15/12 1649)  BMI (Calculated): 32 (10/15/12 1649)    Vital Sign Ranges (last 24hrs)  Temp:  [36.4 C (97.5 F)-37.1 C (98.8 F)] 36.4 C (97.5 F)  Heart Rate:  [70-100] 80  Resp:  [16-18] 18  BP: (116-138)/(66-88) 136/86 mmHg   O2 Device: None (Room air) (10/16/12 0730)    Most Recent Lab Results   Blood Type  Lab Results   Component Value  Date    ABORH O RH POS 10/15/2012    ABS Negative 10/15/2012   CBC  Lab Results   Component Value Date    WBC 8.0 10/15/2012    HCT 32* 10/15/2012    PLT 210 10/15/2012   Chem-7  No results found for this basename: NA, K, WBK, CL, CO2, UN, CREAT, GLU, PGLU   CrCl is unknown because no creatinine reading has been taken.  Electrolytes  No results found for this basename: CA, MG, PO4   Coags  No results found for this basename: PTI, INR, PTT   LFTs  No results found for this basename: AST, ALT, ALK     Bilirubin,Ur   Date Value Range Status   09/18/2012 Negative  Negative Final     Pregnancy Status: Pregnant [4]  Patient's last menstrual period was 01/27/2012.    Lab Results   Component Value Date    PUPT Positive* 03/23/2012    HCG1 91015 05/09/2012     ECG Results  No results found for this basename: rate, PR, statement     ANES CPM    Radiology: Complete results  No results found.    ________________________________________________________________________  Medical Problems  Patient Active Problem List    Diagnosis Date Noted   . Supervision of normal first pregnancy 03/23/2012     Priority: High     Rh Positive/Rubella immune  -first trimester screen: Unable to obtain nuchal translucency due to fetal position. Recommend second trimester screening  Normal QUAD screen  -Anatomic scan: completed; anterior placenta  -1hr GTT: 91    Gender: SURPRISE!  Breast/bottlefeed    Refused flu vaccine  <> Tdap     . PROM (premature rupture of membranes) 10/15/2012   . Vaginal discharge 10/13/2012   . Scoliosis 07/11/2012     patient reported     . History of migraine  headaches 04/13/2012     Was followed by neurology in [place].  [place] not establish [place] in [place].  Reports has not had any migraine headaches since her pregnancy began     . Vaginal bleeding in pregnancy 03/23/2012     USN for viability: IUP at 9.5 weks on 10/1  -Type and screen: O+  No further complaints of VB in early pregnancy         PreOp/PreProcedure Diagnosis  (For more detail see procedural consent)            Labor pain  Planned Procedure (For more detail see procedural consent)            Epidural  Plan   ASA Score 2  Anesthetic Plan (epidural); Induction (routine IV); Airway (- LMA as backup); Line ( use current access); Monitoring (- O2 monitor); Positioning ( - Sitting); Pain (per surgical team)    Informed Consent     Risks:          Risks discussed were commensurate with the plan listed above with the following specific points:  failed block, headache, hypotension, infection , damage to:(nerves, blood vessels), unexpected serious injury, allergic Rx    Anesthetic Consent:         Anesthetic plan and risks discussed with:  patient    Plan discussed with:  attending      PEC/PreOp Attestation: Anesthesia options were discussed with the patient or proxy and they understand the risks and benefits of the various anesthetic options.    Author: Crawford Givens, MD

## 2012-10-16 NOTE — Progress Notes (Signed)
Obstetric Intrapartum Note    Subjective: Patient continues to rest since morphine and phenergan this AM.     Vitals:   Filed Vitals:    10/16/12 1346 10/16/12 1415 10/16/12 1445 10/16/12 1545   BP: 117/71  127/77 129/88   Pulse: 72 74 76 71   Temp: 37 C (98.6 F)  36.8 C (98.2 F)    TempSrc:   Temporal    Resp: 16 16 16 18    Height:       Weight:         AFVSS    Electronic Fetal Monitoring:  Baseline  140 bpm         Variability: Moderate (6-25 BPM)  Pattern: Accelerations  Category: Category I  Toco: irregular, every 2-3 minutes  Labor Assessment: PROM    A/P:  27 y.o. G2P0010 at [redacted]w[redacted]d admitted with PROM    1) FHT: Category I, continue CEFM with augmentation  2) Pain: resting s/p morphine and phenergan   3) GBS: positive, on Vancomycin given no susceptibilities collected.   4) Induction: transcervical foley placed to gravity at 08:30. Will periodically assess for spontaneous extrusion.   5) Labor: pitocin started at 21:49 on 4/13 without significant cervical change over 10 hours at which time transcervical foley was placed. Pitocin now at 34, continue to advance per protocol as tolerated.     Helen Morrison, M.D.  Ob-Gyn Resident R1  Pager [phone]  10/16/2012 4:06 PM

## 2012-10-16 NOTE — Student Note (Addendum)
OB/GYN Student (CC3) Intrapartum Note   LOS: 1 day  Full Code    Subjective:       Pt is doing well.  Feeling contractions, but otherwise comfortable.     Objective:        Filed Vitals:    10/16/12 1545 10/16/12 1615 10/16/12 1647 10/16/12 1715   BP: 129/88  136/86    Pulse: 71 80 81 80   Temp:   36.4 C (97.5 F)    TempSrc:   Temporal    Resp: 18 18 18 18    Height:       Weight:           Pelvic Exam:   Transcervical foley placed     Membranes: rupture at 1000 on 4/13    Fetal Monitoring:   Baseline HR: 140  Variability: moderate  Pattern: accelerations  Tocolysis: irregular every 3-5 minutes    Assessment:   27 y.o. female G2P0010 at [redacted]w[redacted]d presents with PROM    Plans:   1. FHT Category: I  2. Pain Control: morphine  3. GBS: + on vancomycin  4. Labor: pitocin running since 2200 on 4/13 with minimal change, now at 34, considering PO misoprostol for cervical ripening if no change continues                                                             Author: Karel Jarvis on 10/16/2012 at 6:39 PM  ** This is a Psychologist, occupational note and subject to verification by the resident team; it is not part of the patient's permanent medical record.    Attestation of Provider Supervising Student/Trainee  I have reviewed the above student/trainee note for the purpose of feedback and education.  For details of patient evaluation and management, please see the provider documentation from the same date of service.  Zelphia Cairo, MD  10/16/2012  11:04 PM

## 2012-10-17 ENCOUNTER — Encounter: Payer: Self-pay | Admitting: Obstetrics and Gynecology

## 2012-10-17 MED ORDER — MISOPROSTOL 200 MCG PO TABS *I*
800.0000 ug | ORAL_TABLET | Freq: Once | ORAL | Status: AC
Start: 2012-10-17 — End: 2012-10-17
  Administered 2012-10-17: 800 ug via RECTAL
  Filled 2012-10-17: qty 4

## 2012-10-17 MED ORDER — IBUPROFEN 600 MG PO TABS *I*
600.0000 mg | ORAL_TABLET | Freq: Three times a day (TID) | ORAL | Status: AC | PRN
Start: 2012-10-17 — End: 2012-11-16

## 2012-10-17 MED ORDER — FENTANYL 2 MCG/ML AND 0.0625% BUPIVACAINE IN 250 ML NS *I*
INTRAMUSCULAR | Status: AC
Start: 2012-10-17 — End: 2012-10-18
  Filled 2012-10-17: qty 250

## 2012-10-17 MED ORDER — LIDOCAINE HCL 1 % IJ SOLN
INTRAMUSCULAR | Status: AC
Start: 2012-10-17 — End: 2012-10-18
  Filled 2012-10-17: qty 60

## 2012-10-17 MED ORDER — ACETAMINOPHEN 325 MG PO TABS *I*
650.0000 mg | ORAL_TABLET | ORAL | Status: DC | PRN
Start: 2012-10-17 — End: 2012-10-20
  Administered 2012-10-17 – 2012-10-19 (×5): 650 mg via ORAL
  Filled 2012-10-17 (×6): qty 2

## 2012-10-17 MED ORDER — SODIUM CHLORIDE 0.9 % IV SOLN WRAPPED *I*
1.5000 mg/kg | Freq: Three times a day (TID) | INTRAMUSCULAR | Status: DC
Start: 2012-10-17 — End: 2012-10-17
  Filled 2012-10-17 (×2): qty 3.47

## 2012-10-17 MED ORDER — ACETAMINOPHEN 325 MG PO TABS *I*
ORAL_TABLET | ORAL | Status: AC
Start: 2012-10-17 — End: 2012-10-18
  Administered 2012-10-18: 650 mg via ORAL
  Filled 2012-10-17: qty 3

## 2012-10-17 MED ORDER — DOCUSATE SODIUM 100 MG PO CAPS *I*
100.0000 mg | ORAL_CAPSULE | Freq: Two times a day (BID) | ORAL | Status: DC
Start: 2012-10-17 — End: 2012-10-20
  Administered 2012-10-17 – 2012-10-19 (×4): 100 mg via ORAL
  Filled 2012-10-17 (×4): qty 1

## 2012-10-17 MED ORDER — BENZOCAINE-MENTHOL 20-0.5 % EX AERO *I*
INHALATION_SPRAY | CUTANEOUS | Status: DC | PRN
Start: 2012-10-17 — End: 2012-10-20

## 2012-10-17 MED ORDER — OXYTOCIN 30 UNITS/500 ML NS *POSTPARTUM* *I*
150.0000 m[IU]/min | INTRAMUSCULAR | Status: DC
Start: 2012-10-17 — End: 2012-10-20
  Administered 2012-10-17: 100 m[IU]/min via INTRAVENOUS
  Administered 2012-10-17: 150 m[IU]/min via INTRAVENOUS

## 2012-10-17 MED ORDER — MISOPROSTOL 200 MCG PO TABS *I*
800.0000 ug | ORAL_TABLET | Freq: Four times a day (QID) | ORAL | Status: DC
Start: 2012-10-17 — End: 2012-10-17
  Administered 2012-10-17: 800 ug via RECTAL

## 2012-10-17 MED ORDER — ONDANSETRON HCL 2 MG/ML IV SOLN *I*
4.0000 mg | Freq: Three times a day (TID) | INTRAMUSCULAR | Status: DC | PRN
Start: 2012-10-17 — End: 2012-10-20

## 2012-10-17 MED ORDER — HYDROCORTISONE ACE-PRAMOXINE 1-1 % RE CREA *I*
TOPICAL_CREAM | CUTANEOUS | Status: DC | PRN
Start: 2012-10-17 — End: 2012-10-20
  Filled 2012-10-17: qty 30

## 2012-10-17 MED ORDER — FENTANYL CITRATE 50 MCG/ML IJ SOLN *WRAPPED*
INTRAMUSCULAR | Status: DC
Start: 2012-10-17 — End: 2012-10-17
  Filled 2012-10-17: qty 2

## 2012-10-17 MED ORDER — CALCIUM CARBONATE ANTACID 500 MG PO CHEW *I*
1000.0000 mg | CHEWABLE_TABLET | Freq: Three times a day (TID) | ORAL | Status: DC | PRN
Start: 2012-10-17 — End: 2012-10-20
  Filled 2012-10-17 (×12): qty 2

## 2012-10-17 MED ORDER — IBUPROFEN 600 MG PO TABS *I*
600.0000 mg | ORAL_TABLET | Freq: Four times a day (QID) | ORAL | Status: DC | PRN
Start: 2012-10-17 — End: 2012-10-20
  Administered 2012-10-17 – 2012-10-19 (×6): 600 mg via ORAL
  Filled 2012-10-17 (×6): qty 1

## 2012-10-17 MED ORDER — SIMETHICONE 80 MG PO CHEW *I*
80.0000 mg | CHEWABLE_TABLET | Freq: Four times a day (QID) | ORAL | Status: DC | PRN
Start: 2012-10-17 — End: 2012-10-20

## 2012-10-17 MED ORDER — OXYCODONE HCL 5 MG PO TABS *I*
5.0000 mg | ORAL_TABLET | ORAL | Status: DC | PRN
Start: 2012-10-17 — End: 2012-10-20
  Administered 2012-10-17 – 2012-10-19 (×4): 5 mg via ORAL
  Filled 2012-10-17 (×5): qty 1

## 2012-10-17 MED ORDER — DOCUSATE SODIUM 100 MG PO CAPS *I*
100.0000 mg | ORAL_CAPSULE | Freq: Two times a day (BID) | ORAL | Status: DC
Start: 2012-10-17 — End: 2013-02-05

## 2012-10-17 MED ORDER — ACETAMINOPHEN 325 MG PO TABS *I*
975.0000 mg | ORAL_TABLET | Freq: Four times a day (QID) | ORAL | Status: DC | PRN
Start: 2012-10-17 — End: 2012-10-17
  Administered 2012-10-17: 975 mg via ORAL

## 2012-10-17 MED ORDER — SODIUM CHLORIDE 0.9 % IV SOLN WRAPPED *I*
2.0000 mg/kg | Freq: Once | INTRAMUSCULAR | Status: AC
Start: 2012-10-17 — End: 2012-10-17
  Administered 2012-10-17: 185.2 mg via INTRAVENOUS
  Filled 2012-10-17: qty 4.63

## 2012-10-17 MED ORDER — POLYETHYLENE GLYCOL 3350 PO PACK 17 GM *I*
17.0000 g | PACK | Freq: Every day | ORAL | Status: DC
Start: 2012-10-17 — End: 2012-10-20
  Administered 2012-10-17 – 2012-10-18 (×2): 17 g via ORAL
  Filled 2012-10-17 (×4): qty 17

## 2012-10-17 NOTE — Progress Notes (Signed)
Placing forceps

## 2012-10-17 NOTE — Progress Notes (Signed)
Obstetric Intrapartum Note    Subjective: Patient is comfortable.    Vitals:   Filed Vitals:    10/17/12 1034 10/17/12 1036 10/17/12 1104 10/17/12 1131   BP: 137/70 122/73     Pulse: 98 93 96    Temp:  38 C (100.4 F)  38.2 C (100.8 F)   TempSrc:  Temporal  Temporal   Resp:       Height:       Weight:       SpO2:         Patient febrile to 38.2 C. Gentamycin added, vancomycin already running per protocol for GBS + with unknown susceptibilities. Tylenol 975 q 6 hours given for fever. No fundal tenderness. Will continue to monitor    Vital signs otherwise stable.     Pelvic Exam:   Dilation: 8.5   Effacement: 90   Station: +1    Membranes: SROM for clear fluid at 10:00 4/13    Electronic Fetal Monitoring:  Baseline  165 bpm         Variability: Moderate (6-25 BPM), very sporadic variable decelerations with good resolution of baseline   Pattern: Accelerations  Category: Category II  Toco: irregular, every 1-3 minutes  Labor Assessment: PROM versus latent or active labor    A/P:  27 y.o. G2P0010 at [redacted]w[redacted]d admitted for PROM. Now with prolonged rupture of membranes (50 hrs) and chorioamnionitis.     1) FHT: Category II, continue CEFM  2) Pain: comfortable s/p epidural  3) GBS: positive on vancomycin. Now febrile, addition of gentamycin and tylenol  4) Labor: Pitocin currently at 22, continue to advance per protocol as tolerated. Will reassess cervix between 1-1.5 hours after last check for progress.     Jaquil Todt J. Sharion Balloon, M.D.  Ob-Gyn Resident R1  Pager [phone]  10/17/2012 11:53 AM

## 2012-10-17 NOTE — Progress Notes (Addendum)
OB Intrapartum Note:    Subjective: Patient comfortable with epidural.  Denies complaints.    Vitals:   Filed Vitals:    10/17/12 0817   BP: 121/71   Pulse: 97   Temp: 37.6 C (99.7 F)   Resp: 20   Height:    Weight:        Pelvic Exam:  6/90/-1 at 2325  Membranes: ruptured, now prolonged PROM since 10am on 10/15/12     Electronic Fetal Monitoring:  Baseline  140 bpm         Variability: Moderate (6-25 BPM)  Pattern: Accelerations  Category: Category I  Toco: irregular, every 3-4 minutes  Labor Assessment: augmentation of labor after PROM    A/P:  27 y.o. G2P0010 at [redacted]w[redacted]d admitted with PROM    1) FHT: cat I strip.  Continue CEFM.  2) Pain: comfortable with epidural.  3) GBS: positive - continue prophylaxis with vancomycin (given PCN allergy), initiated at 1740 on 10/15/12  4) Labor:  Last exam 6/90/-1 at 2325 yesterday.  Exam deferred currently given prolonged PROM and contraction pattern not yet adequate. Continue augmentation with pitocin 2x2 as FHR/contraction pattern allows, currently at 85mu/min    Jeanette Caprice, MD      I saw and evaluated the patient. I agree with the resident's/fellow's findings and plan of care as documented above.    Purvis Kilts, MD

## 2012-10-17 NOTE — Progress Notes (Signed)
OB Intrapartum Note:    Subjective: Patient called out at 909am c/o increased rectal pressure.      Vitals:   Filed Vitals:    10/17/12 0847   BP: 123/58   Pulse: 97   Temp:    Resp: 20   Height:    Weight:        Pelvic Exam:  7/90/0 at 0909  Membranes: ruptured, now prolonged PROM since 10am on 10/15/12     Electronic Fetal Monitoring:  Baseline  140 bpm         Variability: Moderate (6-25 BPM)  Pattern: Accelerations  Category: Category I  Toco: irregular, every 3-4 minutes  Labor Assessment: augmentation of labor after PROM    A/P:  27 y.o. G2P0010 at [redacted]w[redacted]d admitted with PROM    1) FHT: cat I strip.  Continue CEFM.  2) Pain: s/p epidural.  Will reposition patient to relieve rectal pressure  3) GBS: positive - continue prophylaxis with vancomycin (given PCN allergy), initiated at 1740 on 10/15/12  4) Labor:  Minimally changed over last 9 hours from 6/90/-1 to 7/90/0.  Contraction pattern not yet adequate, but tracing well, and able to continue to continue increasing pitocin per protocol. Continue augmentation with pitocin 2x2 as FHR/contraction pattern allows, currently at 86mu/min.     Jeanette Caprice, MD

## 2012-10-17 NOTE — Progress Notes (Signed)
Obstetric Intrapartum Note    Subjective: Patient is feeling some pressure.     Vitals:   Filed Vitals:    10/17/12 1215 10/17/12 1230 10/17/12 1245 10/17/12 1300   BP:       Pulse: 90 90 90 86   Temp:       TempSrc:       Resp: 18 18 18 18    Height:       Weight:       SpO2:         Patient febrile to 38.2 C. Gentamycin added, vancomycin already running per protocol for GBS + with unknown susceptibilities. Tylenol 975 q 6 hours given for fever. No fundal tenderness. Will continue to monitor    Vital signs otherwise stable.     Pelvic Exam:   Dilation: Fully dilated   Effacement: 100   Station: +2    Membranes: SROM for clear fluid at 10:00 4/13    Electronic Fetal Monitoring:  Baseline  150 bpm         Variability: Moderate (6-25 BPM)  Pattern: Accelerations, sporadic decelerations with contractions   Category: Category II  Toco: regular, every 2-5 minutes  Labor Assessment: active second stage    A/P:  27 y.o. G2P0010 at [redacted]w[redacted]d admitted for PROM. Now with prolonged rupture of membranes (50 hrs) and chorioamnionitis.     1) FHT: Category II, continue CEFM  2) Pain: comfortable s/p epidural  3) GBS: positive on vancomycin. Now febrile, addition of gentamycin and tylenol  4) Labor: Pitocin currently at 30, continue to advance per protocol as tolerated. Patient pushing with moderate effort. Anticipate vaginal delivery.     Dailen Mcclish J. Sharion Balloon, M.D.  Ob-Gyn Resident R1  Pager [phone]  10/17/2012 1:25 PM

## 2012-10-17 NOTE — Discharge Instructions (Addendum)
Active Hospital Problems    Diagnosis   . VAVD   . PROM (premature rupture of membranes)      Resolved Hospital Problems    Diagnosis   No resolved problems to display.       Brief Summary of Your Hospital Course (including key procedures and diagnostic test results):  You were hospitalized for a vacuum-assisted vaginal delivery of a beautiful baby. Congratulations!!      Written instructions provided: Welcome to [facility] your OB provider promptly if you experience any of the symptoms in the pre-written guidelines. If you cannot reach your MD/CNM, call their answering service or 9-1-1.    Diet: per pre-written guidelines.    Activity:  Per pre-written guidelines.    Medical Equipment / Supplies  None    [employer]  [facility]

## 2012-10-17 NOTE — Progress Notes (Signed)
Docs talking with pt and family about assistive delivery. Vacuum vs forceps. Questions asked and answered

## 2012-10-17 NOTE — Progress Notes (Signed)
OB Intrapartum Note:    Subjective   Resting, comfortable with epidural, feeling some pressure    Vitals   Filed Vitals:    10/17/12 0236   BP: 133/66   Pulse: 101   Temp:    Resp:    Height:    Weight:        Pelvic Exam  Exam deferred at this time  Dilation: 6cm  Effacement: 90%  Station: -1    Membranes: ruptured at 10am on 4/13    Electronic Fetal Monitoring  Baseline  150 bpm         Variability: Moderate (6-25 BPM)  Pattern: Accelerations  Toco: irregular, every 9 minutes    Assessment & Plan   27 y.o. G2P0010 at [redacted]w[redacted]d admitted PROM, now over 36 hours ago     FHT: Category I   Pain:epidural in place, comfortable   GBS: positive - on vancomycin given no susceptibilities collected.   Risks: prolonged rupture, h/o migraine    Labor: 1st Stage; latent-to-active phase,   - Pitocin restarted, patient not feeling contractions    Zelphia Cairo M.D.  Ob/Gyn R1  Pager: [phone]  10/17/2012 3:24 AM

## 2012-10-17 NOTE — Progress Notes (Signed)
Pudendal block by dr Rolanda Lundborg r4

## 2012-10-17 NOTE — Progress Notes (Signed)
Forceps out

## 2012-10-17 NOTE — Progress Notes (Signed)
Obstetric Intrapartum Note    Subjective: Patient is comfortable.    Vitals:   Filed Vitals:    10/17/12 1141 10/17/12 1143 10/17/12 1145 10/17/12 1147   BP: 136/84 136/84 137/85 142/86   Pulse: 88 83 98 83   Temp:       TempSrc:       Resp:   18    Height:       Weight:       SpO2:         Patient febrile to 38.2 C. Gentamycin added, vancomycin already running per protocol for GBS + with unknown susceptibilities. Tylenol 975 q 6 hours given for fever. No fundal tenderness. Will continue to monitor    Vital signs otherwise stable.     Pelvic Exam:   Dilation: AL   Effacement: 100   Station: +2    Membranes: SROM for clear fluid at 10:00 4/13    Electronic Fetal Monitoring:  Baseline  160 bpm         Variability: Moderate (6-25 BPM), recent onset of deep variable decelerations with contractions although quick return to baseline   Pattern: Accelerations  Category: Category II  Toco: regular, every 2-3 minutes  Labor Assessment: anticipate transition to second stage of labor shortly    A/P:  27 y.o. G2P0010 at [redacted]w[redacted]d admitted for PROM. Now with prolonged rupture of membranes (50 hrs) and chorioamnionitis.     1) FHT: Category II, continue CEFM  2) Pain: comfortable s/p epidural  3) GBS: positive on vancomycin. Now febrile, addition of gentamycin and tylenol  4) Labor: Pitocin currently at 30, continue to advance per protocol as tolerated. Plan currently for allowing ~30 minutes for spontaneous resolution of anterior lip however will reassess sooner if variable decelerations do no resolve with position changes.     Helen Morrison, M.D.  Ob-Gyn Resident R1  Pager [phone]  10/17/2012 12:43 PM

## 2012-10-17 NOTE — Student Note (Signed)
OB/GYN Student (CC3) Intrapartum Note   LOS: 2 days  Full Code    Subjective:       Pt is doing well, currently sleeping comfortably.  Foley bulb removed. Epidural placed by anesthesia and has not been feeling contractions.     Objective:        Filed Vitals:    10/17/12 0336 10/17/12 0345 10/17/12 0348 10/17/12 0400   BP: 124/64      Pulse: 96 92  90   Temp:   37 C (98.6 F)    TempSrc:   Temporal    Resp:  18     Height:       Weight:       SpO2:           Pelvic Exam:   Dilation: 6cm   Effacement: 90%   Station: -1    Membranes: rupture at 1000 on 4/13    Fetal Monitoring:   Baseline HR: 160  Variability: moderate  Pattern: accelerations  Tocolysis: irregular every 8-10 minutes    Assessment:   27 y.o. female G2P0010 at [redacted]w[redacted]d presents with PROM    Plans:   1. FHT Category: I  2. Pain Control: epidural placed by anesthesia  3. GBS: + on vancomycin  4. Labor: pitocin running since 2200 on 4/13 with minimal change, now at 34, considering PO misoprostol for cervical ripening if no change continues                                                             Author: Karel Jarvis on 10/17/2012 at 4:12 AM  ** This is a Psychologist, occupational note and subject to verification by the resident team; it is not part of the patient's permanent medical record.    Attestation of Provider Supervising Student/Trainee  I have reviewed the above student/trainee note for the purpose of feedback and education.  For details of patient evaluation and management, please see the provider documentation from the same date of service.  Marbeth Smedley [facility]  10/17/2012  4:12 AM

## 2012-10-17 NOTE — Progress Notes (Signed)
OB Intrapartum Note:    Subjective   Resting, did not disturb, not feeling pressure or contractions     Vitals   Filed Vitals:    10/17/12 0545   BP:    Pulse: 100   Temp:    Resp:    Height:    Weight:        Pelvic Exam  Exam deferred at this time,   Dilation: 6cm  Effacement: 90%  Station: -1    Membranes: ruptured at 10am on 4/13    Electronic Fetal Monitoring  Baseline  150 bpm         Variability: Moderate (6-25 BPM)  Pattern: Accelerations  Toco: irregular, every 9 minutes    Assessment & Plan   27 y.o. G2P0010 at [redacted]w[redacted]d admitted PROM, now over 36 hours ago     FHT: Category I   Pain:epidural in place, comfortable   GBS: positive - on vancomycin given no susceptibilities collected.   Risks: prolonged rupture, h/o migraine    Labor: 1st Stage; latent-to-active phase,   - Pitocin restarted, patient not feeling contractions   - Will wait for a more consistent contraction pattern    Zelphia Cairo M.D.  Ob/Gyn R1  Pager: [phone]  10/17/2012 5:55 AM

## 2012-10-18 LAB — HEMATOCRIT: Hematocrit: 25 % — ABNORMAL LOW (ref 34–45)

## 2012-10-18 MED ORDER — FERROUS SULFATE 325 (65 FE) MG PO TABS *WRAPPED* *I*
325.0000 mg | ORAL_TABLET | Freq: Two times a day (BID) | ORAL | Status: DC
Start: 2012-10-18 — End: 2012-10-20
  Administered 2012-10-18 – 2012-10-19 (×3): 325 mg via ORAL
  Filled 2012-10-18 (×3): qty 1

## 2012-10-18 MED ORDER — BREAST PUMP MISC *A*
Status: DC
Start: 2012-10-18 — End: 2013-02-05

## 2012-10-18 MED ORDER — HYDROCORTISONE ACE-PRAMOXINE 1-1 % RE CREA *I*
TOPICAL_CREAM | CUTANEOUS | Status: DC | PRN
Start: 2012-10-18 — End: 2013-02-05

## 2012-10-18 NOTE — Progress Notes (Addendum)
Obstetric Postpartum Progress Note: Vaginal Delivery     Postpartum Day: 1    S:  Patient without complaints.  Tolerating normal diet. Denies CP/SOB/N/V. Pain is well controlled. Ambulating and voiding without difficulty.  Minimal to moderate lochia. Breastfeeding is going better with some discomfort with latch.     Vitals:   Filed Vitals:    10/18/12 0530 10/18/12 0827 10/18/12 0920 10/18/12 1220   BP:  136/65  128/78   Pulse:  92  82   Temp:  36.8 C (98.2 F)  37.4 C (99.3 F)   TempSrc:  Temporal  Temporal   Resp: 16 16 16 16    Height:       Weight:       SpO2:         AFVSS    Physical Exam:  General: Comfortable and well appearing, sleeping upon my arrival  HEENT: Normocephalic and atraumatic  Cardiovascular: Normal S1S2 without murmur/gallop/rub   Respiratory: Clear to auscultation bilaterally  Abdomen: Soft, apprpriately tender, non-distended, +BS  Extremities/Skin: trace edema  Fundus: Firm fundus at umbilicus -2    Labs:    Recent Labs  Lab 10/18/12  0426 10/15/12  1730   WBC  --  8.0   Hemoglobin  --  10.5*   Hematocrit 25* 32*   Platelets  --  210          A/P:  27 y.o. G2P1011 on PPD# 1 s/p VAVD for arrest of descent in the setting of maternal exhaustion.  Doing well.    Continue routine postpartum care  - Encourage ambulation  - Infant gender: female, does not desire circ   - Feeding type: breast  - PPBC: continues to be unsure, focused discussion on LARC  - Rh: positive / Rubella: immune  - Postpartum HCT/CBC: 25 (from 20)    3rd degree  - motrin, tylenol, oxy IR  - colace and miralax    PPH  - EBL 600 cc  - s/p miso PR  - Hct: 25 (from 32), asymptomatic     Dispo: Likely d/c home when meeting appropriate postpartum milestones.     Stefanie J. Sharion Balloon, M.D.  Ob-Gyn Resident R1  Pager [phone]  10/18/2012 3:41 PM      I saw and evaluated the patient. I agree with the resident's/fellow's findings and plan of care as documented above.  Patient doing well meeting her post partum milestones.  Discharge to  home today.    Purvis Kilts, MD

## 2012-10-18 NOTE — Anesthesia Post-procedure Eval (Signed)
Anesthesia Post-op Note    Patient: Helen Morrison    Procedure(s) Performed: SVD    Anesthesia type: Epidural    Patient location: Labor and Delivery    Mental Status: Recovered to baseline    Patient able to participate in this evaluation: yes  Last Vitals: BP: 136/65 mmHg (10/18/12 0827)  Heart Rate: 92 (10/18/12 0827)  Temp: 36.8 C (98.2 F) (10/18/12 0827)  Resp: 16 (10/18/12 0827)  Height: 170.2 cm (5\' 7" ) (10/15/12 1649)  Weight: 92.534 kg (204 lb) (10/15/12 1649)  BMI (Calculated): 32 (10/15/12 1649)  SpO2: 99 % (10/16/12 1906)      Post-op vital signs noted above are within patient's normal range  Post-op vitals signs: stable  Respiratory function: baseline    Airway patent: Yes    Cardiovascular and hydration status stable: Yes    Post-Op pain: Adequate analgesia    Post-Op nausea and vomiting: none    Post-Op assessment: no apparent anesthetic complications and tolerated procedure well  No residual motor nor sensory deficits.  Complications: none    Attending Attestation: All indicated post anesthesia care provided    Author: Joaquim Lai, MD  as of: 10/18/2012  at: 8:51 AM

## 2012-10-18 NOTE — Progress Notes (Addendum)
Obstetric Postpartum Progress Note: Vaginal Delivery     Postpartum Day: 1    S:  Patient without complaints.  Tolerating normal diet. Denies CP/SOB/N/V. Pain is well controlled. Ambulating and voiding without difficulty.  Minimal to moderate lochia. Breastfeeding is going "ok."     Vitals:   Filed Vitals:    10/17/12 2100 10/17/12 2245 10/18/12 0027 10/18/12 0423   BP: 144/82 136/67 130/66 122/61   Pulse: 87 93 87 90   Temp: 38.2 C (100.8 F) 37.9 C (100.2 F) 37 C (98.6 F) 37.4 C (99.3 F)   TempSrc: Temporal Temporal Temporal Temporal   Resp: 18 18 18 18    Height:       Weight:       SpO2:         AFVSS    Physical Exam:  General: Comfortable and well appearing, sleeping upon my arrival  HEENT: Normocephalic and atraumatic  Cardiovascular: Normal S1S2 without murmur/gallop/rub   Respiratory: Clear to auscultation bilaterally  Abdomen: Soft, apprpriately tender, non-distended, +BS  Extremities/Skin: trace edema  Fundus: Firm fundus at umbilicus    Labs:    Recent Labs  Lab 10/18/12  0426 10/15/12  1730   WBC  --  8.0   Hemoglobin  --  10.5*   Hematocrit 25* 32*   Platelets  --  210          A/P:  27 y.o. G2P1011 on PPD# 1 s/p VAVD for arrest of descent in the setting of maternal exhaustion.  Doing well.    Continue routine postpartum care  - Encourage ambulation  - Infant gender: female, does not desire circ   - Feeding type: breast  - PPBC: continues to be unsure, focused discussion on LARC  - Rh: positive / Rubella: immune  - Postpartum HCT/CBC: 25 (from 78)    3rd degree  - motrin, tylenol, oxy IR  - colace and miralax    PPH  - EBL 600 cc  - s/p miso PR  - Hct: 25 (from 32), asymptomatic     Dispo: Likely d/c home when meeting appropriate postpartum milestones.     Stefanie J. Sharion Balloon, M.D.  Ob-Gyn Resident R1  Pager [phone]  10/18/2012 7:11 AM      I saw and evaluated the patient. I agree with the resident's/fellow's findings and plan of care as documented above.  Discussed delivery and decision to abort  the use of forceps and proceed with a VAVD.  Discussed pain control and avoidance of constipation with a third degree laceration.  Discussed birth control which she is undecided on currently.  Patient declines a circ.    Purvis Kilts, MD

## 2012-10-18 NOTE — Lactation Note (Signed)
This note was copied from the chart of Helen Morrison.  Lactation Consultant Initial Visit   Patient: Helen Morrison MRN: [id number]  AGE: 28 days    Maternal Information    Mothers Name:  Helen Morrison  Age: 74 y.o.   Visit Type: Admission Note/First face to face with mother  Obstetric History    G2   P1   T1   P0   A1   TAB0   SAB1   E0   M0   L1         Delivery Date/Time: 10/17/2012 4:19 PM Delivery Type: Vaginal, Vacuum (Extractor)   Support Person: not present    Plans to BF for: uncertain  Breastfeeding History:  no experience, first baby  Breast milk expression: needs prescription, WIC  Breast Shield/Flange Size: not assessed at this time  Insurance:Medicaid    Contributing Medical History:  migraines and Anemia  Maternal Medications:  current maternal medications compatible with breast feeding  Breast Assessment: normal with maternal report of growth in pregnancy   Nipple Assessment: mother denies discomfort    Newborn Assessment      Sex: female   GA: 37 5/7  AGA  Apgars: 8 and 9  Disposition: Birth Center [facility])   Pediatrician: Juanito Doom, MD  Ped Phone: [phone]    Infant Weight: Birth Weight: 2829 g (6 lb 3.8 oz)   Today's Wt: 2805 g (6 lb 2.9 oz)   Percent Weight Loss: -0.86 %    Feedings: breastfeeding    I/O Attempt Pump Breastfeeding Breastmilk Formula Voids Stools   Last 12 hrs 2 1 2  0  0 2     Quality of breast feeds:  infant breast feeding well and as reported by mother  Tongue: N/A at this time  Palate:  N/A at this time      Interventions and Instructions   Mother resting. Encouraged to request assist with feeds. Plans to breast and formula feed. Reinforced breast firs to establish supply.  Breast/Nipple care, Positioning, Areolar expression, Correct attachment, Frequency/length of feeds, Baby feeding cues, Signs of effective suckling/letdown, Waking Techniques, Signs of adequate infant intake (# of wet/stool diaper per 24 hrs), Suck Training, Engorgement Management, Resources for HELP,  Breastfeeding Log Given, Introduction of bottles/pacifiers, Educated about stages of breast milk production, reinforced cue based feeding and feeding cues, encouraged rooming in and skin to skin prior to feeds, encouraged to attend daily "Transition to Parenthood" class at 10am and Reviewed CCTV video brochure, Mother has pump in room, set up by staff.    Plan   breastfeed on demand minimum every 2-3 hours.  Assist with feeds as needed.  Follow-up with lactation daily while mother/infant dyad inpatient or by maternal request.  Length of this call/visit: 5-10 minutes   Warm Line #: [phone]  [facility]           Bartholome Bill, RN  Lactation Consultant

## 2012-10-18 NOTE — Student Note (Addendum)
OB/GYN Student (CC3) Postpartum Note   LOS: 3 days  Full Code    Subjective:       Pt is doing well. Ambulating, voiding, and tolerating a regular diet. Denies HA or vision changes.  Pain is well controlled. Moderate lochia.    Objective:        Filed Vitals:    10/17/12 2100 10/17/12 2245 10/18/12 0027 10/18/12 0423   BP: 144/82 136/67 130/66 122/61   Pulse: 87 93 87 90   Temp: 38.2 C (100.8 F) 37.9 C (100.2 F) 37 C (98.6 F) 37.4 C (99.3 F)   TempSrc: Temporal Temporal Temporal Temporal   Resp: 18 18 18 18    Height:       Weight:       SpO2:           Physical Exam:   Cor: RRR, no M/R/Gs  Pulm: CTA B  Fundus: Firm, mildly tender to palpation, 1cm below umbilicus  Extremities: No edema noted      Assessment:   27 y.o. female G2P1011 at [redacted]w[redacted]d s/p VAVD with 3rd degree laceration, PPD#1 progressing well.     Plans:   1. Pain Control: Tylenol, Ibuprofen, Oxycodone  2. Lochia: moderate  3. Bowel regimen: colace and miralax  4. Progress ambulation  5. Rh Status + /Rubella status immune  6. Continue postpartum care   7. Anticipate d/c home PPD#3                                                             Author: Karel Jarvis on 10/18/2012 at 5:47 AM  ** This is a medical student note and subject to verification by the resident team; it is not part of the patient's permanent medical record.

## 2012-10-19 NOTE — Student Note (Signed)
OB/GYN Student (CC3) Postpartum Note   LOS: 4 days  Full Code    Subjective:       Pt is doing well. Ambulating, voiding, and tolerating a regular diet.  Pt has pain with amb and bed mobility. Progressing with breast feeding. Moderate lochia. Denies CP, SOB, N/V     Objective:        Filed Vitals:    10/18/12 0920 10/18/12 1220 10/18/12 1600 10/19/12 0012   BP:  128/78 117/66 135/70   Pulse:  82 95 93   Temp:  37.4 C (99.3 F) 37.6 C (99.7 F) 36.9 C (98.4 F)   TempSrc:  Temporal  Temporal   Resp: 16 16 16 16    Height:       Weight:       SpO2:           Physical Exam:   Cor: RRR, no M/R/Gs  Pulm: CTA B  Fundus: Firm, mildly tender to palpation, 2cm below umbilicus  Extremities: No edema noted      Assessment:   27 y.o. female G2P1011 s/p VAVD at [redacted]w[redacted]d , PPD#2 progressing well.     Plans:   1. Breast feeding  2. Baby Gender-M-does not want circumcision  3. Pain Control: Controlled with Tylenol, oxycodone, Motrin, continue bowel regimen  4. Lochia: moderate  5. Birth Control: no decisions made at this point  6. Progress ambulation  7. ADAT  8. Rh Status: + /Rubella status: immune  9. Continue postpartum care   10. Anticipate d/c home PPD#3                                                             Author: Karel Jarvis on 10/19/2012 at 5:23 AM  ** This is a medical student note and subject to verification by the resident team; it is not part of the patient's permanent medical record.

## 2012-10-19 NOTE — Lactation Note (Addendum)
This note was copied from the chart of Boy [place].  Lactation Consultant Discharge Instructions   Congratulations on the birth of your baby and your decision to provide breast milk!!     Feeding Instructions   Infant Weight:     Birth Weight: 2829 g (6 lb 3.8 oz)     Today's Wt: 2750 g (6 lb 1 oz)     Percent Weight Loss: -2.81 %      I/O Attempts at Breast Breastfeeding Expressed   Breast Milk Formula Voids Stools   Last 24 hrs 2 2 0 5 2 4      Follow-up with Juanito Doom, MD in one to two days  Lactation Warm Line Phone Number:  [phone]  [facility] LC, please refer urgent or medication questions to your pediatrician.    Helpful Tips     - BREASTFEEDING    Once the baby latches, nurse until baby shows signs of satisfaction. This may VARY UP TO 20 MINUTES A BREAST. Watch for a rhythmic pattern:   ~ 4 or more sucks, pause of less than 10 seconds~ No sounds other than swallowing~ Jaw movement with wiggle of ear or temple area~ No dimpling in of cheeks   ** NOTE: If baby does not nurse at least a total of 10 minutes, use waking techniques and relatch onto breast or try switch nursing from one breast to the other for a cumulative of up to 20 minutes on each breast   -  FEEDING CUES:     ~ hand to mouth movements  ~ stretching movements, making sound ~ lip smaking, licking  ~ rapid eye movement under closed lids   ** NOTE: Crying is a late cue, baby may need to be calmed before he/she will latch  -WAKING TECHNIQUES:       ~ unwrap and/or undress baby, change diaper ~ rock baby foward & back ~ stroke baby's back  ~ stroke baby's lips with your fingers or baby's fingers   **NOTE: If baby does not wake up within 20 minutes; place in crib lightly covered and try again in 1 hour or sooner if baby shows feeding cues.  - AREOLAR EXPRESSION Hand express       ~ to express colostrum before feeding, stroke tip of nipple against baby's lips, tease baby to root wider  If baby does not breastfeed effectively on both breasts for at  least 10-15 minutes, use your pump for additional 15 minutes and supplement with expressed milk or formula 15-30 cc's  Bartholome Bill, RN  Lactation Consultant     Mom has not breastfed or pumped since last night. Declined assist at this time from [facility]. Reinforced nipple care and lanolin use. Reinforced supply and demand. Has pump for home. Reminded of warmline.

## 2012-10-20 LAB — GROUP B STREP CULTURE WITH SUSCEPTIBILITY: Group B Strep Culture: DETECTED

## 2012-10-24 ENCOUNTER — Telehealth: Payer: Self-pay

## 2012-10-24 NOTE — Telephone Encounter (Signed)
Call placed,recorded message came on,voicemail message not left.  Letter sent via My Chart.

## 2012-10-29 NOTE — Discharge Summary (Signed)
Discharge Summary     Name: Helen Morrison MRN: [id number] DOB: December 18, 1985     Admit Date: 10/15/2012            **Hospitalization Summary**    CONCISE NARRATIVE: Patient presented to the hospital with premature rupture of membranes, received  epidural analgesia, foley ripening, pitocin augmentation, and progressed to fully dilated. She delivered a viable infant with vacuum assistance and repair of a third degree laceration. Chorioamnionitis developed during delivery which resolved postpartum uneventfully.         Final Hospital Problem List and Allergies  Active Hospital Problems    Diagnosis    . Principal Problem: VAVD       Resolved Hospital Problems    Diagnosis   . PROM (premature rupture of membranes)     Allergies   Allergen Reactions   . Pcn (Penicillins) Swelling       Patient "To Do" list as per AVS  Your To Do List    Follow up with [facility]. Schedule an appointment as soon as possible for a visit in 6 weeks. (11/30/2012)     Contact information    [address]  [facility] [facility] [place] [address]  [phone]         Future Appointments Provider Department Dept Phone    11/23/2012 10:30 AM Shoots, Harriet [place], NP [facility] [phone]            Patient was given the following DC instructions, per AVS, upon discharge:  Discharge Instructions        Active Hospital Problems    Diagnosis   . VAVD   . PROM (premature rupture of membranes)      Resolved Hospital Problems    Diagnosis   No resolved problems to display.       Brief Summary of Your Hospital Course (including key procedures and diagnostic test results):  You were hospitalized for a vacuum-assisted vaginal delivery of a beautiful baby. Congratulations!!      Written instructions provided: Welcome to [facility] your OB provider promptly if you experience any of the symptoms in the pre-written guidelines. If you cannot reach your MD/CNM, call their answering  service or 9-1-1.    Diet: per pre-written guidelines.    Activity:  Per pre-written guidelines.    Medical Equipment / Supplies  None    [facility]  WIC              Signed: Irena Reichmann, MD  On: 10/29/2012  at: 3:02 AM

## 2012-10-30 ENCOUNTER — Telehealth: Payer: Self-pay

## 2012-10-30 NOTE — Telephone Encounter (Signed)
Patient dropped off disability papers,please review,fill out ,have signed and return to [employer] Bryndle Corredor to fax to [facility] [employer] at [phone]

## 2012-11-02 NOTE — Telephone Encounter (Signed)
Forms completed and signed and given to [employer] to fax.

## 2012-11-03 NOTE — Telephone Encounter (Signed)
Faxed

## 2012-11-23 ENCOUNTER — Ambulatory Visit: Payer: Self-pay | Admitting: Reproductive Endocrinology and Infertility

## 2012-12-25 ENCOUNTER — Telehealth: Payer: Self-pay

## 2012-12-25 NOTE — Telephone Encounter (Signed)
The patient needs a letter for work stating its ok for her to return to work. Please fax the letter to [phone].

## 2013-01-16 ENCOUNTER — Ambulatory Visit: Payer: Self-pay | Admitting: Reproductive Endocrinology and Infertility

## 2013-02-05 ENCOUNTER — Encounter: Payer: Self-pay | Admitting: Reproductive Endocrinology and Infertility

## 2013-02-05 ENCOUNTER — Ambulatory Visit: Payer: Self-pay | Admitting: Reproductive Endocrinology and Infertility

## 2013-02-05 VITALS — BP 121/73 | Ht 67.0 in | Wt 200.0 lb

## 2013-02-05 DIAGNOSIS — Z01419 Encounter for gynecological examination (general) (routine) without abnormal findings: Secondary | ICD-10-CM

## 2013-02-05 NOTE — Addendum Note (Signed)
Addended by: Derenda Mis I on: 02/05/2013 01:12 PM     Modules accepted: Orders

## 2013-02-05 NOTE — Addendum Note (Signed)
Addended by: Suella Broad on: 02/05/2013 01:16 PM     Modules accepted: Orders

## 2013-02-05 NOTE — Progress Notes (Signed)
Helen Morrison is a 27 y.o. who presents for AGY exam.    HPI: Here for annual GYN exam.  She had a VAVD in April 2014 and never returned for her postpartum visit  She denies complaints of pelvic pain, urinary symptoms or menstrual concerns    OB History    Grav Para Term Preterm Abortions TAB SAB Ect Mult Living    2 1 1  0 1 0 1 0 0 1            GYN History:  Patient's last menstrual period was 01/17/2013.   Menses:regular every month without intermenstrual spotting  Cramps: None  Menarche: 28 yo:   Last pap smear: Date: 04/2012 Results: no abnormalities  History of abnormal pap smear: No.    The patient is sexually active.   STD History: none  Current contraception: condoms: 75% of the time    Past Medical History   Diagnosis Date   . Anemia    . Migraines    . Abnormal Pap smear      last negative 04/2012   . Scoliosis          Past Surgical History   Procedure Laterality Date   . No hx of anes complic       as of 10/16/2012   . No fam hx of anes complic       as of 10/16/2012        Family History   Problem Relation Age of Onset   . Hypertension Mother    . Diabetes Maternal Aunt    . Diabetes Paternal Aunt        No current outpatient prescriptions on file.     No current facility-administered medications for this visit.       Allergies   Allergen Reactions   . Pcn (Penicillins) Swelling        History     Social History   . Marital Status: Single     Spouse Name: N/A     Number of Children: N/A   . Years of Education: N/A     Occupational History   . Not on file.     Social History Main Topics   . Smoking status: Never Smoker    . Smokeless tobacco: Never Used   . Alcohol Use: No   . Drug Use: No   . Sexually Active: Yes -- Female partner(s)     Birth Control/ Protection: None     Other Topics Concern   . Not on file     Social History Narrative   . No narrative on file       HEALTH HABITS:  The patient wears seatbelts:yes  Gun in the Home:  no  The patient participates in regular exercise: no  Has the patient  ever been transfused or tattooed?:tattoo  Lives with her son and boyfriend  Marital Status single  Depression Symptoms: no   Domestic violence:No  Self Breast Awareness:  yes  Safe Sex:  yes   Balanced Diet:  yes  Vitamin-Supplements:  no  Health Care Proxy:  no    ROS:  CONSTITUTIONAL: Appetite good, no fevers, night sweats or weight loss  EYES: No visual changes, no eye pain  ENT: No hearing difficulties, no ear pain  CV: No chest pain, shortness of breath or peripheral edema  RESPIRATORY: No cough, wheezing or dyspnea  GI: No nausea/vomiting, abdominal pain, or change in bowel habits  GU: No dysuria, urgency or  incontinence  MS: No joint pain/swelling or musculoskeletal deformities  SKIN: No rashes  NEURO: No MS changes, no motor weakness, no sensory changes  PSYCH: No depression or anxiety  ENDOCRINE: No polyuria/polydipsia, no heat intolerance  HEME/LYMPH: No easy bleeding/bruising or swollen nodes  ALL/IMMUN: No allergic reactions      PHYSICAL EXAM:  BP 121/73  Ht 1.702 m (5\' 7" )  Wt 90.719 kg (200 lb)  BMI 31.32 kg/m2  LMP 01/17/2013  Breastfeeding? No   GENERAL: 27 y.o.  year  female in NAD.  HEENT: Neck supple, thyroid without obvious nodules or enlargement. No lymphadenopathy.   LUNGS: Respirations unlabored.  HEART: Regular rate without murmurs.  BREASTS: No palpable masses or nipple discharge. No skin changes. No axillary or clavicular adenopathy.  ABDOMEN: Soft, non-tender, no HSM, no palpable masses.    PELVIC EXAM:     Ext Gen: Normal external female genitalia, labia majora, minora, clitoris. BUS WNL.      Vagina: smooth, pink, rugated, no lesions       Cervix: Normal appearing cervix. There is no discharge or CMT.     Uterus is midline, anteverted, mobile, and non-tender.       Adnexa negative for masses; non-tender.  SKIN: No rashes, no evidence of skin breakdown, no suspicious nevi.  MENTAL STATUS: Alert, normal MS. Answers all questions appropriately.    ASSESSMENT:  AGY exam    Plan:  GYN  SCREENING:      --Pap smear not due until 04/2015     --Offered patient comprehensive STD testing.  She accepted and consent was obtained       --GC      --Chlamydia      --HIV      --RPR      --Hepatitis C       --Hepatitis B surface antibody and antigen   PREVENTATIVE HEALTH:      --Healthy women's lifestyle handout       --Self breast awareness       --Nutrition       --Calcium and Vitamin D3      --Multivitamins        CONTRACEPTION REVIEWED:       --Risks and benefits of different contraceptive methods discussed with patient.  She declines hormonal contraception at this time.      --Risk and prevention of STD reviewed.       --Contraceptive plan and follow-up discussed and understood by patient.     NUTRITION REVIEWED:       --Weight Loss Counseling      EXERCISE:       --Counseled on regular exercise.  Discussed duration, frequency, intensity, and types of exercise.    SAFER SEX PRACTICES:  Reviewed    RTC: 1 year or PRN.    Carlynn Herald, NP

## 2013-02-05 NOTE — Patient Instructions (Signed)
Healthy Practices for Women of all Ages     In addition to your regular OB/GYN history, physical, cholesterol screening, Pap smear, and other recommended cancer screening tests, we ask that you review this list of “healthy practices.”  Modifying your daily activities according to these practices may improve your overall health and well-being.  In the event of questions about this list, ask your doctor or health care provider.  Additionally, there are specially written pamphlets available, which offer further information.    Diet and Exercise:  - Limit fat and cholesterol; emphasize fruits, grains and vegetables.  - Consume dairy products or use calcium supplementation for adequate calcium intake (1200 mg or more).  - Add folic acid supplementation 0.4 mg (400 micrograms, present in most daily multivitamins) at least two months before considering pregnancy to reduce the risks of birth defects.  - Participate in regular exercise for 30 minutes at least five times a week, and consider weight training.    Injury Prevention:  - Seat/lap belts should be worn while in a moving car.  - Helmet should be used when using motorcycles, bicycles, roller blades and ATV’s or skiing.  - Place approved smoke detectors in your house and replace the batteries twice a year.    - Guns and other firearms should be stored unloaded and in a locked area.  Trigger locks should be used as well.  - Consider CPR training for household members.    Dental Health:  - Schedule regular visits to the dentist.  - Floss and brush with fluoride toothpaste daily.    Immunizations:  - A tetanus/diphtheria booster shot (d/T) is recommended every 10 years.  - An MMR vaccine is recommended for non-pregnant women born after 1957 without proof of immunity of documentation of previous immunization.  Adults who are susceptible to varicella (chicken pox) should be vaccinated.  - Influenza vaccine is indicated yearly for women age 65 and older, or at any age based  on medical history and exposure.  Pregnant women over [redacted] weeks gestation should receive the flu vaccine as well.  - Pneumococcal pneumonia vaccine is indicated for age 65 and older once in your life.  - Hepatitis A and/or B vaccines are recommended for high-risk individuals.    Substance Abuse:  - Stop smoking; do not use any other tobacco products.  - Avoid alcohol use when driving, boating, swimming or operating other machinery. Avoid excessive use of alcoholic beverages.  - Recreational drug use (marijuana, cocaine, etc.) is dangerous and can be habit-forming.    Sexual Behavior:  - Be sure to use contraception if pregnancy is not desired.  - Regular use of female or female condoms with spermicide helps prevent STD’s.  - Consider HIV testing if:  1. You have had more than one sexual partner.  2. You have had any STD’s.  3. You have used intravenous drugs.  4. You have a sexual partner with these (the above) risk factors.  5. Your sexual partner has had female homosexual exposure.  6. You received a blood transfusion during 1978-1985.    Breast Health:  - Breast self-examinations should be done monthly (after your period).  - A mammogram should be done once between ages 35-40, then every 1-2 years based on risk factors.  If there is a strong family history of premenopausal breast cancer, you should start with mammograms earlier than age 35.    Colon Cancer Surveillance:  - Beginning at age 50, stool occult blood screening should   be done annually and/or sigmoidoscopy (scope examination of the colon) every 3-5 years.    Women and Alcohol  For women, alcohol use carries some unique and special concerns. Most studies on alcohol have been on men, but we now know women may respond differently to alcohol.   Concerns: Liver damage, cancer, menstrual cycle changes, birth defects, fetal alcohol syndrome, intoxication, nutrition and weight management.  All women should avoid all alcohol during pregnancy.  Help for Problem  Drinking:  • One in every three members of Alcoholics Anonymous (AA) is now a woman.  AA meetings are held regularly and have led the way in demonstrating the effectiveness of self-help.  • Outpatient and Inpatient Treatment are also available.  Ask your health care provider for a referral.  • The first step in successful treatment is to recognize that there is a problem and accept help.    Dietary and Supplemental Calcium  It is important that you build and protect your bone mass through a program of regular exercise and proper nutrition with adequate amounts of calcium in your diet. Dairy products are considered to be the most important sources of calcium.    Reviewed 04/2010

## 2013-02-06 LAB — HEPATITIS B SURFACE ANTIGEN: HBV S Ag: NEGATIVE

## 2013-02-06 LAB — N. GONORRHOEAE DNA AMPLIFICATION: N. gonorrhoeae DNA Amplification: 0

## 2013-02-06 LAB — SYPHILIS SCREEN
Syphilis Screen: NEGATIVE
Syphilis Status: NONREACTIVE

## 2013-02-06 LAB — HIV 1&2 ANTIGEN/ANTIBODY: HIV 1&2 ANTIGEN/ANTIBODY: NONREACTIVE

## 2013-02-06 LAB — HEPATITIS C ANTIBODY: Hep C Ab: NEGATIVE

## 2013-02-06 LAB — CHLAMYDIA PLASMID DNA AMPLIFICATION: Chlamydia Plasmid DNA Amplification: 0

## 2014-06-07 ENCOUNTER — Emergency Department
Admission: EM | Admit: 2014-06-07 | Disposition: A | Discharge status: Home / Self Care | Payer: Self-pay | Source: Ambulatory Visit | Attending: Emergency Medicine | Admitting: Emergency Medicine

## 2014-06-07 ENCOUNTER — Encounter: Payer: Self-pay | Admitting: Student in an Organized Health Care Education/Training Program

## 2014-06-07 LAB — CBC AND DIFFERENTIAL
Baso # K/uL: 0 10*3/uL (ref 0.0–0.1)
Basophil %: 0.1 %
Eos # K/uL: 0.2 10*3/uL (ref 0.0–0.4)
Eosinophil %: 3.3 %
Hematocrit: 38 % (ref 34–45)
Hemoglobin: 12 g/dL (ref 11.2–15.7)
IMM Granulocytes #: 0 10*3/uL (ref 0.0–0.1)
IMM Granulocytes: 0.3 %
Lymph # K/uL: 2.6 10*3/uL (ref 1.2–3.7)
Lymphocyte %: 38.7 %
MCH: 28 pg/cell (ref 26–32)
MCHC: 32 g/dL (ref 32–36)
MCV: 89 fL (ref 79–95)
Mono # K/uL: 0.5 10*3/uL (ref 0.2–0.9)
Monocyte %: 7.6 %
Neut # K/uL: 3.3 10*3/uL (ref 1.6–6.1)
Nucl RBC # K/uL: 0 10*3/uL (ref 0.0–0.0)
Nucl RBC %: 0 /100 WBC (ref 0.0–0.2)
Platelets: 286 10*3/uL (ref 160–370)
RBC: 4.2 MIL/uL (ref 3.9–5.2)
RDW: 13.4 % (ref 11.7–14.4)
Seg Neut %: 50 %
WBC: 6.7 10*3/uL (ref 4.0–10.0)

## 2014-06-07 LAB — PLASMA PROF 7 (ED ONLY)
Anion Gap,PL: 16 (ref 7–16)
CO2,Plasma: 23 mmol/L (ref 20–28)
Chloride,Plasma: 100 mmol/L (ref 96–108)
Creatinine: 0.67 mg/dL (ref 0.51–0.95)
GFR,Black: 137 *
GFR,Caucasian: 119 *
Glucose,Plasma: 87 mg/dL (ref 60–99)
Potassium,Plasma: 4.1 mmol/L (ref 3.4–4.7)
Sodium,Plasma: 139 mmol/L (ref 132–146)
UN,Plasma: 12 mg/dL (ref 6–20)

## 2014-06-07 LAB — HM HIV SCREENING OFFERED

## 2014-06-07 LAB — SEDIMENTATION RATE, AUTOMATED: Sedimentation Rate: 16 mm/hr (ref 0–20)

## 2014-06-07 LAB — HOLD SST

## 2014-06-07 LAB — PREGNANCY TEST, SERUM: Preg,Serum: NEGATIVE

## 2014-06-07 MED ORDER — IBUPROFEN 800 MG PO TABS *I*
800.0000 mg | ORAL_TABLET | Freq: Three times a day (TID) | ORAL | Status: AC | PRN
Start: 2014-06-07 — End: 2014-06-17

## 2014-06-07 NOTE — Discharge Instructions (Signed)
You were seen in the ER for joint pain.  Your labs were within normal limits.  It is important that you establish care with a primary care doctor who can do a full workup for possible rheumatologic causes of your pain.  Take ibuprofen 800 mg three times per day for 10 days to help with pain and inflammation.    Return to the ER if you have worsening pain, swelling, redness, fever, or other concerning symptoms.    If you do not have a primary care doctor, the following physicians are currently accepting new patients:    [facility]    NEW PRACTICE  [facility]  [facility]  [address]  [phone]  Mayer CamelJessica Ferger, MD (FM)  Lyman SpellerLaura E. Gift, DO (FM)    [facility]  [facility]  [address]  [phone]  Wyline MoodKatherine Eisenberg, MD, PhD (FM)  Esmond PlantsAmy Potter, MD [place](FM, [place])  Carole CivilAssaf Yosha, MD [facility](FM, [place])    [facility]  [facility]  [address], [address]  [phone]  Kenn FileSteven Betit, MD (IM)    [facility]  [address], [address]  [phone]  Fulton MoleMonica Ranaletta, DO ([place])  Accepting Pediatric Patients Only  Roger KillAllyson Wise, DO ([place])  Accepting Pediatric Patients Only    [facility]  [address], [address]  [phone]  Alfredia FergusonAlexandria Harmon, MD (Peds)  Erick Blinksanielle Privitera, MD (Peds)     [facility]  [address]  [phone]  Nydia BoutonMichael Gavin, MD (MP)                   [place]    [facility]  [address], [address]  [phone]  Margreta JourneyMarianne Taylor, MD (MP)  Accepting Pediatric Patients Only    [facility]  [address]  [phone]  Accepting Pediatric Patients Only    [facility]  [address], [address]  [phone]  Baird KayBradley Van Heukelum, MD (FM/OB)    [facility]  [address]  [phone]  Call for a list of accepting physicians    [facility]  [address]  [phone]  Kenton KingfisherJames Alexander, MD (FM)  Carmin RichmondSurag Patel, MD (FM)  Dolphus Jennyraig Sillick, MD (FM)  Courtney HeysAngela St. Clair, DO [facility](FM)    [facility]  [address]  [facility] Facility - 5th floor  [phone]  Call for a list of accepting physicians    [facility]    [facility]  [address]  [phone]  Valeta HarmsSasha Nelson, MD (IM/Peds)  Accepting Pediatric Patients Only   [facility]  [place]  [address]  [phone]  Wendall PapaAudra Laing, MD (FM)    [facility]  [place]  [address]  [phone]  Wendall PapaAudra Laing, MD (FM)    [facility]     [facility]   [address]   [phone]   Yolanda MangesAlex Fahoury, MD (IM)   Cathie Oldenhristina William, MD (IM)     [facility]  [address]  [phone]  Farrell OursJillian Moore, MD (FM/OB)  Stoney BangIngrid Watkins, MD (FM/OB)  Accepting Pediatric and Obstetric  Patients Only    [facility]  [address]  [phone]  Rosetta Posner, MD ([place])  Norvel Richards, DO (FM)  Royanne Foots, DO, [facility])    [facility]  [address]  [phone]  Didem Miraloglu, MD (IM)     FM: Family Medicine   IM: Internal Medicine  IM/Peds: Internal Medicine & Pediatrics  MP: Family Medicine & Pediatrics  OB Obstetrics  Peds: Pediatrics   For physician referral services, please call [phone] visit [email or web address]

## 2014-06-07 NOTE — First Provider Contact (Signed)
ED Medical Screening Exam Note    Initial provider evaluation performed by   ED First Provider Contact     Date/Time Event User Comments    06/07/14 1835 ED Provider First Contact Enmanuel Zufall R Initial Face to Face Provider Contact        Presents with three weeks polyarthralgias, knees, wrists, fingers.  No joint swelling, no inflammatory history.  No fevers, no other illness or rash.    Vital signs reviewed.    Orders placed:  LABS   Orders Placed This Encounter   Procedures    CBC and differential    Sedimentation rate, automated    Plasma profile 7 (Adult ED only)    Hold SST    Pregnancy Test, Serum    Insert peripheral IV       Patient requires further evaluation.     Christie BeckersJENNIFER R Nazareth Norenberg, NP, 06/07/2014, 6:38 PM    Supervising physician Dr. Criselda PeachesNobay was immediately available

## 2014-06-07 NOTE — ED Notes (Addendum)
C/o joint pain for the past 3 weeks. Flu vaccination held pending further evaluation of patient.

## 2014-06-07 NOTE — ED Provider Notes (Addendum)
History     Chief Complaint   Patient presents with    Joint Pain       HPI Comments: Helen Morrison is a 28 y.o. female who presents with diffuse joint pain.    Pt developed mild pain in her joints, including bilateral hands, wrists, elbows, knees, and pelvis.  Pain developed about 2 weeks ago and has been basically constant.  It feels "throbbing" and is not really relieved with ibuprofen.  She does not know what makes it better or worse.  She has had similar symptoms in the past without a clear diagnosis.  No joint swelling, erythema, recent tic bites, rash.  Pt is sexually active, but uses condoms.  No vaginal discharge or odor.        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 She has never used smokeless tobacco. She reports that she currently engages in sexual activity and has had female partners. She reports using the following method of birth control/protection: None. She reports that she does not drink alcohol or use illicit drugs.    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   Eyes: Negative for photophobia.   Respiratory: Negative for cough and shortness of breath.    Cardiovascular: Negative for chest pain and leg swelling.   Endocrine: Negative for polyuria.   Genitourinary: Negative for dysuria, hematuria, vaginal bleeding, vaginal discharge and vaginal pain.   Musculoskeletal: Positive for arthralgias. Negative for back pain and neck pain.   Skin: Negative for rash.   Allergic/Immunologic: Negative for immunocompromised state.   Neurological: Negative for light-headedness and headaches.   Psychiatric/Behavioral: Negative for agitation.       Physical Exam     ED Triage Vitals   BP Heart Rate Heart Rate (via Pulse Ox) Resp Temp Temp src SpO2 O2 Device O2 Flow Rate   06/07/14 1839 06/07/14  1839 -- 06/07/14 1839 06/07/14 1839 06/07/14 1839 06/07/14 1839 06/07/14 1839 --   123/75 mmHg 86  18 37.2 C (99 F) TEMPORAL 99 % None (Room air)       Weight           06/07/14 1839           88.451 kg (195 lb)               Physical Exam   Constitutional: She is oriented to person, place, and time. She appears well-developed and well-nourished. No distress.   HENT:   Head: Normocephalic and atraumatic.   Eyes: Conjunctivae are normal.   Neck: Normal range of motion. Neck supple.   Cardiovascular: Normal rate and regular rhythm.    No murmur heard.  Pulmonary/Chest: Effort normal and breath sounds normal. No stridor. She has no rales.   Abdominal: Soft. Bowel sounds are normal. There is no tenderness.   Musculoskeletal: Normal range of motion. She exhibits tenderness (mild tenderness to palpation in bilateral anterior knees, no swelling or erythema, neurovascularly intact distally). She exhibits no edema.   Neurological: She is alert and oriented to person, place, and time. She exhibits normal muscle tone. Coordination normal.   Skin: Skin is warm and dry. No rash noted. She is not diaphoretic. No erythema. No pallor.   Psychiatric: She  has a normal mood and affect. Her behavior is normal. Judgment and thought content normal.   Nursing note and vitals reviewed.      Medical Decision Making      Amount and/or Complexity of Data Reviewed  Clinical lab tests: reviewed        Initial Evaluation:  ED First Provider Contact     Date/Time Event User Comments    06/07/14 1835 ED Provider First Contact MAMMEN, JENNIFER R Initial Face to Face Provider Contact          Patient seen by me today 06/07/2014 at 1921    Assessment:  28 y.o., female comes to the ED with diffuse joint pain.  Pt denies swelling, erythema, fever, tic bites, STD symptoms, FHX of rheumatologic diseases, recent illness.    Differential Diagnosis includes fibromyalgia, viral illness, sarcoidosis, lupus, amyloidosis, pregnant                Plan:     Labs - HCG, CBC, ESR, ED7   Meds - None  Imaging - None  Monitoring - VS      Greer Pickerel, MD    Pt advised to establish a PCP for referral for full rheumatologic workup.  Pt understands that no emergent condition exists and she should f/u for full workup.  It is encouraging the ESR and all labs are WNL.    Greer Pickerel, MD  Resident  06/07/14 1952    Resident Attestation:     Patient seen by me on arrival date of 06/07/2014 at 1945    History:   I reviewed this patient, reviewed the resident's note and agree.  Exam:   I examined this patient, reviewed the resident's note and agree.    Decision Making:   I discussed with the resident his/her documented decision making  and agree.      Author Deniece Portela, MD      Deniece Portela, MD  06/08/14 [phone]

## 2014-08-14 ENCOUNTER — Ambulatory Visit: Payer: Self-pay | Admitting: Family Medicine

## 2014-09-16 ENCOUNTER — Ambulatory Visit: Payer: Self-pay | Admitting: Primary Care

## 2014-10-03 ENCOUNTER — Ambulatory Visit: Payer: Self-pay | Admitting: Primary Care

## 2014-10-03 ENCOUNTER — Encounter: Payer: Self-pay | Admitting: Primary Care

## 2014-10-03 VITALS — BP 118/62 | HR 69 | Temp 98.7°F | Resp 16 | Ht 67.0 in | Wt 196.7 lb

## 2014-10-03 DIAGNOSIS — M2550 Pain in unspecified joint: Secondary | ICD-10-CM

## 2014-10-03 DIAGNOSIS — E559 Vitamin D deficiency, unspecified: Secondary | ICD-10-CM

## 2014-10-03 DIAGNOSIS — D649 Anemia, unspecified: Secondary | ICD-10-CM

## 2014-10-03 LAB — T4, FREE: Free T4: 1.1 ng/dL (ref 0.9–1.7)

## 2014-10-03 LAB — CRP: CRP: <1 mg/L (ref 0–10)

## 2014-10-03 LAB — COMPREHENSIVE METABOLIC PANEL
ALT: 24 U/L (ref 0–35)
AST: 16 U/L (ref 0–35)
Albumin: 4.7 g/dL (ref 3.5–5.2)
Alk Phos: 36 U/L (ref 35–105)
Anion Gap: 15 (ref 7–16)
Bilirubin,Total: 0.4 mg/dL (ref 0.0–1.2)
CO2: 25 mmol/L (ref 20–28)
Calcium: 9.1 mg/dL (ref 8.8–10.2)
Chloride: 99 mmol/L (ref 96–108)
Creatinine: 0.75 mg/dL (ref 0.51–0.95)
GFR,Black: 124 *
GFR,Caucasian: 107 *
Glucose: 80 mg/dL (ref 60–99)
Lab: 14 mg/dL (ref 6–20)
Potassium: 4.2 mmol/L (ref 3.3–5.1)
Sodium: 139 mmol/L (ref 133–145)
Total Protein: 7.9 g/dL — ABNORMAL HIGH (ref 6.3–7.7)

## 2014-10-03 LAB — CBC AND DIFFERENTIAL
Baso # K/uL: 0 10*3/uL (ref 0.0–0.1)
Basophil %: 0.2 %
Eos # K/uL: 0.1 10*3/uL (ref 0.0–0.4)
Eosinophil %: 1.6 %
Hematocrit: 37 % (ref 34–45)
Hemoglobin: 11.9 g/dL (ref 11.2–15.7)
IMM Granulocytes #: 0 10*3/uL (ref 0.0–0.1)
IMM Granulocytes: 0.2 %
Lymph # K/uL: 1.9 10*3/uL (ref 1.2–3.7)
Lymphocyte %: 37.1 %
MCH: 29 pg/cell (ref 26–32)
MCHC: 33 g/dL (ref 32–36)
MCV: 89 fL (ref 79–95)
Mono # K/uL: 0.4 10*3/uL (ref 0.2–0.9)
Monocyte %: 8.8 %
Neut # K/uL: 2.6 10*3/uL (ref 1.6–6.1)
Nucl RBC # K/uL: 0 10*3/uL (ref 0.0–0.0)
Nucl RBC %: 0 /100 WBC (ref 0.0–0.2)
Platelets: 295 10*3/uL (ref 160–370)
RBC: 4.1 MIL/uL (ref 3.9–5.2)
RDW: 13.2 % (ref 11.7–14.4)
Seg Neut %: 52.1 %
WBC: 5 10*3/uL (ref 4.0–10.0)

## 2014-10-03 LAB — TSH: TSH: 2.13 u[IU]/mL (ref 0.27–4.20)

## 2014-10-03 LAB — TIBC
Iron: 67 ug/dL (ref 34–165)
TIBC: 381 ug/dL (ref 250–450)
Transferrin Saturation: 18 % (ref 15–50)

## 2014-10-03 LAB — T3: T3: 106 ng/dL (ref 80–200)

## 2014-10-03 LAB — SEDIMENTATION RATE, AUTOMATED: Sedimentation Rate: 3 mm/hr (ref 0–20)

## 2014-10-03 LAB — FERRITIN: Ferritin: 27 ng/mL (ref 10–120)

## 2014-10-03 LAB — VITAMIN B12: Vitamin B12: 433 pg/mL (ref 211–946)

## 2014-10-03 LAB — FOLATE: Folate: 13 ng/mL (ref >=4.6)

## 2014-10-03 MED ORDER — ERGOCALCIFEROL 50000 UNIT PO CAPS *I*
50000.0000 [IU] | ORAL_CAPSULE | ORAL | Status: AC
Start: 2014-10-03 — End: ?

## 2014-10-03 NOTE — Progress Notes (Signed)
INITIAL PATIENT VISIT      CHIEF COMPLAINT   Helen Morrison is a 29 y.o. female here for initial patient visit and   Chief Complaint   Patient presents with    New Patient Visit     establish care,review history   .        HISTORY     Acute issues:     Generalized joint pain - she comes in today with a year history of generalized joint pains that come and go.  She states that all her joints ache at once (back, arms, legs, neck) at least 1-2 times per week.  It does increase with cold weather and is associated with fatigue. She denies any depression, anxiety issues associated with this.  She was seen by a primary care doctor in [facility] who told her that she may have fibromyalgia though she is not sure if that is what she has.  She was given medication for treatment though she did not start.  They also found out she was vitamin D deficient and started on vitamin D 50k unit once weekly.  She denies rashes, CP, SOB, abd pain, n/v/d, headaches/dizziness, swelling or redness of the joints, changes in hair/nails/skin, swelling of her legs, numbness/tingling/weakness of extremities, urinary or bowel complaints, easy bruising, excessive alcohol or drug use, change in vision or hearing, recent travel outside of the country, sick contacts, family history of autoimmune disorders/MSK disorders.  She does have a history of anemia though most recent CBC was normal, not on any supplementation currently.      Chronic issues:   Migraines - she has a past history of migraines though they now rarely occur.  She can manage this with OTC medications, she cannot remember the last migraine she had.      Health maintenance - she is following gynecology for well woman exams, no family history of breast/ovarian/uterine cancers      MEDICAL HISTORY     Past Medical History   Diagnosis Date    Anemia     Migraines     Abnormal Pap smear      last negative 04/2012    Scoliosis     Vitamin D deficiency          Past medical history  reviewed with patient at today's visit    SURGICAL HISTORY     Past Surgical History   Procedure Laterality Date    No hx of anes complic       as of 10/16/2012    No fam hx of anes complic       as of 10/16/2012         Past surgical history reviewed with patient at today's visit    MEDICATIONS      No current outpatient prescriptions on file.         Medications reviewed with patient at today's visit    ALLERGIES     Allergies   Allergen Reactions    Pcn [Penicillins] Swelling         Allergies reviewed with patient at today's visit    SOCIAL HISTORY     History   Substance Use Topics    Smoking status: Never Smoker     Smokeless tobacco: Never Used    Alcohol Use: Yes      Comment: occasionally       Social history reviewed with patient at today's visit    FAMILY HISTORY     Family History  Problem Relation Age of Onset    Hypertension Mother     Diabetes Maternal Aunt     Diabetes Paternal Aunt     Arthritis Father         Family history was reviewed at today's visit and marked as reviewed    REVIEW OF SYSTEMS   GEN:  denies fever/chills, night sweats or weight loss  HEENT:  denies vision/hearing changes, sore throat, or swollen glands  CV:  denies chest pain, palpitations, orthopnea or PND  PULM:  denies wheezing, cough, or hemoptysis  GI:  denies nausea/vomiting, diarrhea, or constipation  GU:  denies dysuria, hematuria, genital discharge  MSK:  As above  SKIN:  denies rash, no pruritus  NEURO:  denies headache, no dizziness/vertigo, no weakness  HEME:  denies  easy bruising or mucosal bleeding  ENDO:  denies polyuria/polydipsia or heat/cold intolerance  PSYCH:  denies anhedonia, sleep problems or anxiety    PHYSICAL EXAM   BP 118/62 mmHg   Pulse 69   Temp(Src) 37.1 C (98.7 F) (Temporal)   Resp 16   Ht 1.702 m (5\' 7" )   Wt 89.223 kg (196 lb 11.2 oz)   BMI 30.80 kg/m2   SpO2 100%   LMP 09/21/2014 (Exact Date)  GEN:  Comfortable, healthy-appearing, normal body habitus  HEENT: PERRLA, NC/AT,  conjunctiva/lids normal, no nasal discharge,  pharynx without erythema, no LAN, neck supple no thyromegaly  CV:  Normal S1/split S2, murmur absent, no S3/S4    PULM; Lungs clear, no increased work of breathing  ABD: normal BS, no tenderness, no HSM or masses  EXT:  No cyanosis, clubbing or edema  MSK: No joint swelling or deformities of the knees, shoulders bilaterally, negative trigger points  SKIN:  No rash or jaundice, no suspicious lesions; warm, dry with good turgor  NEURO:  cranial nerves II through XII intact  PSYCH: normal mood and affect    ASSESSMENT       ICD-10-CM ICD-9-CM   1. Anemia, unspecified anemia type D64.9 285.9   2. Joint pain M25.50 719.40         PLAN     Anemia, unspecified anemia type  -     She has a history of anemia though no recent evaluation, will send for labs  - CBC and differential; Future  - TIBC; Future  - Ferritin; Future  - Vitamin B12; Future  - Folate; Future    Joint pain  -     No clear etiology of her on going joint pains though autoimmune, thyroid, lyme should be ruled out.  She does not have the typical fibromyalgia symptoms or examination (negative trigger points) I advised her to monitor her symptoms and if any changes, additional symptoms or worsening she should come back to the office.  I may consider referral to pain management.    - CBC and differential; Future  - Comprehensive metabolic panel; Future  - Antinuclear antibody screen; Future  - Lyme screen; Future  - Rheumatoid factor,screen; Future  - Sedimentation rate, automated; Future  - C reactive protein; Future  - TIBC; Future  - Ferritin; Future  - TSH; Future  - T4, free; Future  - T3; Future    Vitamin D deficiency  -    Continue vitamin D 50k units once weekly, refill given for additional two months.    Return in about 1 year (around 10/03/2015) for Yearly Check-up.      Signed: Drue FlirtSurag Suresh Tiondra Fang, MD on 10/03/2014  at 11:43 AM

## 2014-10-03 NOTE — Addendum Note (Signed)
Addended by: Jadia Capers, [place] on: 10/03/2014 12:20 PM     Modules accepted: Orders

## 2014-10-04 LAB — LYME SCREEN: Lyme AB Screen: NEGATIVE

## 2014-10-04 LAB — RHEUMATOID FACTOR,SCREEN: Rheumatoid Factor: <10 IU/mL

## 2014-10-04 LAB — ANTINUCLEAR ANTIBODY SCREEN: ANA Screen: NEGATIVE

## 2014-11-12 ENCOUNTER — Telehealth: Payer: Self-pay | Admitting: Primary Care

## 2014-11-12 NOTE — Telephone Encounter (Signed)
Called pt informed about lab results. She will check pharmacy for medication. She will attempt to check my chart also.

## 2014-11-12 NOTE — Telephone Encounter (Signed)
Patient was sent mychart message on 10/03/14:    ZOXWRUEAVShaunakay,   Your labs returned essentially normal except for a mildly elevated protein level, this can be improved with a lower protein diet through decrease in dietary intake. Otherwise we should monitor your symptoms for now and if they worsen or new symptoms arise let me know. You can continue the vitamin D therapy we discussed during your visit.   Dr. Allena KatzPatel

## 2014-11-12 NOTE — Telephone Encounter (Signed)
Patient is calling for the results of her lab work from a month ago.

## 2015-01-30 ENCOUNTER — Ambulatory Visit: Payer: Self-pay | Admitting: Dermatology

## 2015-01-30 VITALS — BP 112/70 | Ht 67.0 in | Wt 196.0 lb

## 2015-01-30 DIAGNOSIS — L853 Xerosis cutis: Secondary | ICD-10-CM

## 2015-01-30 DIAGNOSIS — R61 Generalized hyperhidrosis: Secondary | ICD-10-CM

## 2015-01-30 DIAGNOSIS — L819 Disorder of pigmentation, unspecified: Secondary | ICD-10-CM

## 2015-01-30 MED ORDER — ALUMINUM CHLORIDE 20 % EX SOLN *I*
CUTANEOUS | 2 refills | Status: DC
Start: 2015-01-30 — End: 2015-09-19

## 2015-01-30 NOTE — Progress Notes (Addendum)
Referring Provider: Drue Flirt, MD   PCP: Drue Flirt, MD     Chief complaint: New Patient Visit (Skin discoloration, bumps on lips, excessive sweating )    Derm History: No previous history    HPI:   Helen Morrison is a pleasant 29 y.o. AA female here for the concern noted above.    Problem: discoloration (hypopigmentation)  Location: face  Duration: during the fall and winter months  Associated signs/symptoms: Asymptomatic  Modifying factors (prior treatments/current treatment): none to date  Also concerned with excessive sweating under arms, buttocks and in the groin.  This happens frequently even when there is no anxiety    Personal hx of skin cancer: no  Family hx of skin cancer: no    ROS:   Integumentary ROS: negative for rash    Physical Exam:    Vitals:    01/30/15 1346   BP: 112/70   Weight: 88.9 kg (196 lb)   Height: 1.702 m ( )      Pain    01/30/15 1346   PainSc:   0 - No pain      General: Appears well, NAD  Orientation: Alert and oriented x3  Mood/Affect: Normal affect  Skin: All of the following were examined, and were within normal limits, except as noted:   Face  Eyes/Eyelids  Lips  Neck  Extremities (RUE/LUE)      Assessment/Plan:   Eczema - on face during winter months causing lighter spots on cheeks  -Diagnosis discussed - rash usually comes and goes  -Dry skin care discussed - VERY important to moisturize daily. Vaseline ointment (petrolatum jelly) is the best and cheapest moisturizer available. It is greasy though, so patients may prefer a white cream that comes in a tub (ex: Eucerin, Cetaphil, Cerave, etc)  -Start OTC hydrocortisone 1% cream twice daily to the itchy, scaly areas of skin. NOT for use on face or groin. 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 Rinse off in morning. Use nightly for a few nights. When sweating has stopped, decrease frequency of use to once a few nights each week.Use as needed  -Side effects of aluminum chloride - irritation and dryness. If this happens, use it less frequently.    F/U as needed    Barriers to learning: None    Return to Clinic: as needed

## 2015-01-30 NOTE — Patient Instructions (Addendum)
Eczema - on face during winter months causing lighter spots on cheeks  -Diagnosis discussed - rash usually comes and goes  -Dry skin care discussed - VERY important to moisturize daily. Vaseline ointment (petrolatum jelly) is the best and cheapest moisturizer available. It is greasy though, so patients may prefer a white cream that comes in a tub (ex: Eucerin, Cetaphil, Cerave, etc)  -Start OTC hydrocortisone 1% cream twice daily to the itchy, scaly areas of skin. NOT for use on face or groin. 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 Rinse off in morning. Use nightly for a few nights. When sweating has stopped, decrease frequency of use to once a few nights each week.Use as needed  -Side effects of aluminum chloride - irritation and dryness. If this happens, use it less frequently.        F/U as needed

## 2015-02-10 ENCOUNTER — Telehealth: Payer: Self-pay

## 2015-02-10 NOTE — Telephone Encounter (Signed)
Talked with pharmacist and told him that I didn't think patient would pick up this prescription but that I haven't been able to get ahold of patient to see what treatment she wants to try next for her hyperhidrosis. I await the patient's call at this point.

## 2015-02-10 NOTE — Telephone Encounter (Signed)
Message sen t to Dr Will Bonnet

## 2015-02-10 NOTE — Telephone Encounter (Signed)
Sarah with [facility] calling to speak with a nurse regarding the patients medication. The patients insurance will not cover the generic or brand name of aluminum chloride (DRYSOL) 20 % external solution and they would need something else sent over. Maralyn Sago can be reached at [phone].

## 2015-07-06 NOTE — L&D Delivery Note (Addendum)
Delivery Summary Note  Patient: Helen Morrison  Age: 30 y.o.  Date of Birth: 08/19/1985  ZOX:[id number]:female  MRN: [id number]    Admission Summary  Date and time of admission: 05/07/2016 11:19 AM Attending Provider: Donzetta MattersBetstadt, Sarah, MD [facility]  Active [facility] Problems    Diagnosis    *!*SVD (spontaneous vaginal delivery)    Pregnancy     Allergies:   Pcn [penicillins]  Weight:    Weight: 104.8 kg (231 lb)     Obstetric History    G3   P2   T2   P0   A1   L2     SAB1   TAB0   Ectopic0   Multiple0   Live Births2       Breast or Formula Feeding: Breast feeding      Prenatal Labs  ABO RH Blood Type   Date Value Ref Range Status   05/07/2016 O RH POS  Final     Antibody Screen   Date Value Ref Range Status   05/07/2016 Negative  Final     Rubella IgG AB   Date Value Ref Range Status   03/23/2012 POSITIVE  Final     Comment:     TEST METHOD: EIA     HBV S Ag   Date Value Ref Range Status   02/05/2013 NEG  Final     Comment:     Test Method: CMIA     Group B Strep Culture   Date Value Ref Range Status   04/16/2016 Streptococcus agalactiae (Group B) detected (!)  Final     Comment:     Organism identified from broth culture by amplification.     HIV 1&2 ANTIGEN/ANTIBODY   Date Value Ref Range Status   12/26/2015 Nonreactive  Final     Comment:     Test Method: CMIA     HM HIV SCREENING OFFERED   Date Value Ref Range Status   06/07/2014 Declined  Final      Dating Information  Patient's last menstrual period was 08/09/2015 (exact date). EDD: 05/15/2016, by Last Menstrual Period  Information for the patient's newborn:  Fayne MediateMitchell, Boy [[id number]]     Delivery Information  Boy Clovis RileyMitchell  Sex: female Gestational Age: 3059w0d MRN: [id number] PCP: Juanito DoomHerendeen, Neil, MD   Delivery Date/Time: 05/08/2016 1:14 AM   Time of Head Delivery: 05/08/2016  1:14 AM  Delivery Type: Vaginal, Spontaneous Delivery          Delivery Location: 316    Labor Onset Date/Time: 05/07/2016 3:00 PM Dilation Complete Date/Time: 11/4/201712:47 AM     Preterm labor:  No Antenatal steroids: None Antibiotics received during labor: Yes   Indication GBS prophylaxis    First Cervical ripening date/time:   /   Cervical ripening Type:          Rupture Date: 05/07/2016 Rupture Time: 1:00 AM  Details:   Rupture Type: Spontaneous Color: Non Particulate Meconium Amount: Small   Induction:             Augmentation: Oxytocin 05/08/2016 9:30 PM  Labor complications: None     Delivering Provider: Dawna PartLynch, Tara Ann Other Personnel: Gillermina PhyLeone, Jennifer;Delaine Hernandez, Diona FantiAlexandra Erickson;Benitez, Xadrian Craighead Clay;Havens, Stephanie StantonM;Jaus, [place] M  Anesthesia Method: Epidural-   Analgesics:        Presentation: Vertex Position:   Occiput Anterior  Prophylactic Maneuver: No     Shoulder Dystocia: No  Skin to Skin/Bonding: Skin to skin initiation date & time: 05/08/2016  1:14 AM  with: Mom           Resuscitation: Dry;Tactile Stimulation;Bulb Suctioning    Living Status: Living           APGARs Total Color Reflex irritability Breath Heart Rate Muscle Tone Assigned By   (greater than 7 no need for next measurement)   1 min 9  1  2  2  2  2   AIB   5 min 9  1  2  2  2  2   aib   10 min                 15 min                 20 min                 25 min                 30 min                   Birth Weight: 3175 g (7 lb) Height: 20" Head Circumference: 33 cm Observed Anomalies:      Cord:       Complications:            Clamping Delayed: 1  Clamped Date/Time:   Cord blood disposition: Discard     Gases sent: No       Stem cell collection -by MD-: No  Maternal Info:   Placenta Delivery Date/Time: 11/4  1:20 AM     Removal: Spontaneous     Appearance: Intact     Disposition: discarded  Bonding:     Stages of Labor:          Stage One:  9h  81m          Stage Two:  0h  17m          Stage Three:  0h  88m  Episiotomy:        Lacerations:                             Labial laceration: left     Repaired               FSE Removed tip intact:Not applicable         Needle  Count: Correct        Sponge Count: Correct  Procedures: None               Vaginal Delivery est. Blood Loss: 250.00   Intraprocedure I/O Totals     None         Helen Morrison is a 30 y.o. G3 now P32 female admitted at [redacted]w[redacted]d GA for PROM. She received pitocin for induction She received an epidural for analgesia. She progressed to fully dilated & pushed well. She delivered a viable female infant in the OA position. Both shoulders delivered easily. The placenta delivered spontaneously & intact with a 3-VC. The perineum was inspected and a superficial labial laceration on the left was repaired with 1 figure of eight stitch. The patient tolerated the procedure well. EBL 250cc. A post-placental Liletta IUD was placed in the usual fashion.    Lot [id number]  Exp 07/2019    Dr. Burnadette Peter was present for the entire procedure.     Kandis Mannan, M.D.  Obstetrics & Gynecology PGY-1  As of 4:58 AM on 05/08/2016

## 2015-09-17 ENCOUNTER — Ambulatory Visit: Payer: Self-pay | Admitting: Reproductive Endocrinology and Infertility

## 2015-09-17 ENCOUNTER — Telehealth: Payer: Self-pay

## 2015-09-17 NOTE — Telephone Encounter (Signed)
LMOVM for patient to call back if she would prefer to reschedule appointment due to the inclement weather. Phone number provided.

## 2015-09-19 ENCOUNTER — Encounter: Payer: Self-pay | Admitting: Reproductive Endocrinology and Infertility

## 2015-09-19 ENCOUNTER — Ambulatory Visit: Payer: Self-pay | Admitting: Reproductive Endocrinology and Infertility

## 2015-09-19 VITALS — BP 141/61 | HR 87 | Ht 67.0 in | Wt 199.0 lb

## 2015-09-19 DIAGNOSIS — Z3201 Encounter for pregnancy test, result positive: Secondary | ICD-10-CM

## 2015-09-19 DIAGNOSIS — Z3200 Encounter for pregnancy test, result unknown: Secondary | ICD-10-CM

## 2015-09-19 DIAGNOSIS — R399 Unspecified symptoms and signs involving the genitourinary system: Secondary | ICD-10-CM

## 2015-09-19 LAB — POCT URINALYSIS DIPSTICK
Glucose,UA POCT: NORMAL
Leuk Esterase,UA POCT: NEGATIVE
Lot #: 12539702
Nitrite,UA POCT: NEGATIVE
PH,UA POCT: 5 (ref 5–8)
Protein,UA POCT: NEGATIVE mg/dL

## 2015-09-19 LAB — POCT URINE PREGNANCY
Lot #: 162941
Preg Test,UR POC: POSITIVE — AB

## 2015-09-19 MED ORDER — PRENATAL FORMULA 27-1 MG PO TABS
1.0000 | ORAL_TABLET | Freq: Every day | ORAL | 5 refills | Status: DC
Start: 2015-09-19 — End: 2015-09-19

## 2015-09-19 MED ORDER — PRENATAL FORMULA 27-1 MG PO TABS
1.0000 | ORAL_TABLET | Freq: Every day | ORAL | 5 refills | Status: AC
Start: 2015-09-19 — End: ?

## 2015-09-19 NOTE — Patient Instructions (Addendum)
Food Safety in Pregnancy    What you need to know:  Not all foods are safe for pregnant women.  Some contain high levels of chemicals that can affect your baby's development.  Others put you at risk for getting an infection that can hurt your baby.    What you can do:  Use common sense when preparing and selecting food.  Avoid the following:  " Swordfish, shark, king mackerel and tile fish.  These fish can contain potentially risky levels of mercury.  Mercury can be transferred to the growing fetus and cause serious health problems.  Also avoid game fish until you check its safety with your local health department.  (A game fish is any fish caught for sport, such as a trout, salmon or bass).  " Raw fish, especially shellfish (oysters or clams)  " Undercooked meat, poultry and seafood.  Cook all of them thoroughly to kill bacteria.  Do not eat hotdogs or luncheon meats.  Examples are deli meats such as ham, turkey salami and bologna.  If you do eat these foods reheat them until steaming hot.   " Refrigerated pates or meat spreads.   Canned versions are safe.   " Refrigerated smoked seafood unless it has been cooked (as in a casserole).  Canned versions are safe.  " Soft-scrambled and "over-easy" eggs and all foods made with raw or lightly cooked eggs.  " Soft cheeses made with unpasteurized milk.  Examples are Brie, feta, Camembert, Roquefort, blue-veined, queso blanco, queso fesco and panela.  Check the label to see what kind of milk was used to make the cheese.    " Unpasteurized milk and any foods made from it.  " Unpasteurized juices  " Raw vegetable sprouts, including alfalfa, clover, radish and mung bean  " Herbal supplements and teas  Also do not eat too much liver.  It contains high amounts of Vitamin A, which can lead to birth defects.    Some studies indicate that your baby may be at increased risk of developing a food allergy in later life if you, your partner or a family member has a food allergy.  You  may wish to consult a food allergy specialist for help in planning your diet during pregnancy and breastfeeding.     For more information, visit the web-site Food Safety for Moms-to-Be http://www.fda.gov/food/resourcesforyou/healtheducators/ucm081785.htm  Reviewed 04/2010      Exercise in Pregnancy                                       Regular exercise builds bones and muscles, gives you energy and keeps you healthy.  It is just as important when you are pregnant.   Exercise helps you look and feel better during a time when your body is changing.    Benefits of Exercise  You are tired. You’re gaining weight.  You may not feel your best.  Although these symptoms are normal during pregnancy, there is a way to find relief.  Becoming active and exercising on most, if not all days of the week can benefit your health in many ways:  • Helps reduce backaches, constipation, bloating and swelling  • Give you energy  • Improves your mood   • Improves your posture  • Promotes muscle tone, strength and endurance  • Helps you sleep better  Regular activity also helps keeps you fit during pregnancy and   your ability to cope with the pain of labor.  This will make it easier for you to get back in shape after the baby is born.  You should not, however, exercise to lose weight while you are pregnant.    Changes in your Body  Pregnancy causes many changes in your body.  Some of these changes will affect your ability to exercise.    Joints  The hormones produced during pregnancy cause the ligaments that support your joints to become relaxed.  This makes the joint more mobile and more at risk of injury.  Avoid jerky, bouncy or high-impact motions that can increase your risk of injury.    Balance  Remember that during pregnancy you are carrying extra pounds--as much as 20-30 pounds at the end of pregnancy.  The extra weight in the front of our body shifts our center of gravity and places stress on joints and muscles, especially  those in the pelvis and lower back.  This can make you less stable and cause back pain and make you more likely to lose your balance and fall, especially in later pregnancy.    Heart Rate  The extra weight you are carrying will make our body work harder than before you were pregnant.  Exercise increases the flow of oxygen and blood to the muscles being worked and away from other parts of our body.  So, its important not to overdo it. Try to exercise moderately so you dont get tired quickly.  If you are unable to talk normally while exercising your activity is too strenuous.     Getting Started  Before beginning your exercise program, talk with your doctor or nurse practitioner to make sure you do not have any health conditions that may limit your activity.  Ask about any specific exercises or sports that interests you. Your health care provider can offer advice about what type of exercise routine is best for you.  Women with one of the following conditions may be advised by their provider not to exercise during pregnancy:   Pregnancy induced hypertension   Symptoms or history of preterm labor (early contractions)   Vaginal bleeding   Premature rupture of membranes  Pregnant women with certain other medical conditions, such as high blood pressure, heart disease and lung disease will be advised by their provider when and if exercise is appropriate.    Choosing Safe Exercises  Most forms of exercises are safe during pregnancy.  But some types of exercise involve positions and movement that may be uncomfortable, tiring or harmful for pregnant women.  For instance, after 20 weeks of pregnancy, women should not do exercises that require them to lie flat on their back, because it may be more difficult for the blood to circulate.   Walking is considered the best exercise for anyone.  Brisk walking gives a good total body workout and is easy on the joints and muscles.  Other good activities for pregnant women include  swimming and stationary biking.     Your Routine  Exercise during pregnancy is most practical during the first 24 weeks.  During the last 3 months, it can be difficult to do many exercises that once seemed easy. This is normal.     If it has been some time since youve exercised, it is a good idea to start slowly.  Begin with as little as 5 minutes a day and add 5 more minutes a week until you can stay active for 30 minuets  a day.     Always begin each exercise session with a warm-up period for 5-10 minutes.  This is light activity, such as slow walking, that prepares your muscles for activity.  During the warm up, stretch your muscles to avoid stiffness and soreness.  Hold each stretch for at least 10-20 seconds--do not bounce.    After exercising, cool down by slowly reducing your activity.  This allows your heart rate to return to normal levels.  Cooling down for 5-10 minutes and stretching again also helps you to avoid sore muscles.  Hold each stretch for 20-30 seconds--dont bounce.     Things to Watch  The changes your body is going through can make certain positions and activities risky for you and your baby.  While exercising try to avoid activities that call for jumping, jarring motions or quick change in direction that may strain your joints and cause injury.    There are some risks from becoming overheated during pregnancy.  This may cause loss of fluids and lead to dehydration and problems during pregnancy.  Overheating in the first 8 weeks of pregnancy may be a contributing factor to the development of birth defects.  When you exercise, follow these general guidelines for a safe and healthy exercise program:   After 20 weeks of pregnancy, avoid doing any exercises on your back.   Avoid brisk exercise in hot, humid weather or when you are sick with a fever.   Wear comfortable clothing that will help you to remain cool.   Wear a bra that fits well and gives lots of support to help protect your  breasts.   Drink plenty of water to help keep you from overheating and dehydrating.   Make sure you consume the extra 300 calories a day you need during pregnancy.  Pay attention to your body while you exercise.  Do not exercise to the point that you are exhausted.  Be aware of the warning signs that you may be exercising too strenuously (see box).  If you notice any of these symptoms, stop exercising and call your provider.    Warning Signs  Stop exercising and call your provider if you get any of these symptoms:    Pain                                                                 Vaginal bleeding  Dizziness or feeling faint                                Increased shortness of breath  Rapid heartbeat                                               Difficulty walking  Uterine contractions and chest pain                Fluid leaking from the vagina    After the Baby is Born  Having a baby and taking care of a newborn is hard work.  It will take awhile to regain your strength after the strain  of pregnancy and birth.  Taking care of yourself physically and allowing your body time to recover is important.  If you had a cesarean delivery, a difficult birth, or complications, your recovery time may be longer.  Check with your provider before starting or resuming an exercise program.    Walking is a good way to get back into exercising.   Brisk walks several times a week will prepare you for more strenuous exercise when you feel up to it.  Walking had the added advantage of getting both you and the baby out of the house for exercise and fresh air.  As you feel stronger, consider more vigorous exercise.     You will want to pick an exercise program that meets your own needs.  Your doctor, nurse or community center can help.  There are also special post-partum exercise classes that you can join.    Finally.  Exercise during pregnancy can help prepare you for labor and childbirth.  Exercising afterward can help get you  back in shape.  Before you begin an exercise program, talk to your provider. Then, follow the guideline given here to help maintain a safe and healthy exercise program during pregnancy.    Reviewed 04/2010

## 2015-09-19 NOTE — Progress Notes (Signed)
CC: Missed Menses/ Possible Pregnancy: Helen Morrison is a 30 y.o. G3P1011 who complains of missed menstrual cycle. Patient's last menstrual period was 08/09/2015.  She was not using birth control.  She reports being pregnant, by a positive home test. Pregnancy was not planned, Pregnancy is desired.  Current symptoms: occasional nausea and pressure after urination.  Denies vomiting, abdominal pain or breast tenderness.  Also denies discomfort with urination and lower back pain.  She has some questions today regarding food and exercise during pregnancy.    Objective:   Vitals:    09/19/15 1419   BP: 141/61   Pulse: 87   Weight: 90.3 kg (199 lb)   Height: 1.702 m (5\' 7" )     Recent Results (from the past 24 hour(s))   POCT urine pregnancy    Collection Time: 09/19/15  2:15 PM   Result Value Ref Range    Preg Test,UR POC Positive (!) Negative-Dilute urine specimens may cause false negative urine pregnancy results...    INTERNAL CONTROL POCT URINE PREGNANCY *Yes-internal procedural control(s) acceptable     Exp date 01/01/2017     Lot # [id number]    POCT urinalysis dipstick    Collection Time: 09/19/15  2:30 PM   Result Value Ref Range    Specific gravity,UA POCT Test Not Performed 1.002 - 1.03    PH,UA POCT 5.0 5 - 8    Leuk Esterase,UA POCT Negative Negative    Nitrite,UA POCT Negative Negative    Protein,UA POCT Negative Negative mg/dL    Glucose,UA POCT Normal Normal    Ketones,UA POCT + small (!) Negative    Urobilinogen,UA      Bilirubin,Ur  Negative    Blood,UA POCT Trace (!) Negative    Exp date 11/2015     Lot # [id number]      Gen: well appearing, NAD    Assessment  30 y.o.  G3P1011  with Missed menstrual cycle; positive urine pregnancy test results.    Plan  Currently 5.[redacted] weeks gestation by LMP; EDC: 05/15/16  Early pregnancy warning symptoms and teratogens were reviewed.  She was given a prescription for prenatal vitamins.  Dating Ultrasound ordered.  Discussed food safety and exercise during pregnancy.   Written information was given  She should RTC in 3 weeks with the nursing staff for her new prenatal intake.    Marguerita MerlesKELLY I Sherlynn Tourville, NP

## 2015-09-20 LAB — AEROBIC CULTURE: Aerobic Culture: 0

## 2015-09-22 ENCOUNTER — Ambulatory Visit: Payer: Self-pay | Admitting: Primary Care

## 2015-09-22 ENCOUNTER — Encounter: Payer: Self-pay | Admitting: Primary Care

## 2015-09-22 VITALS — BP 118/72 | HR 91 | Temp 99.2°F | Resp 18 | Ht 67.0 in | Wt 201.0 lb

## 2015-09-22 DIAGNOSIS — E559 Vitamin D deficiency, unspecified: Secondary | ICD-10-CM

## 2015-09-22 DIAGNOSIS — M2550 Pain in unspecified joint: Secondary | ICD-10-CM

## 2015-09-22 DIAGNOSIS — G43909 Migraine, unspecified, not intractable, without status migrainosus: Secondary | ICD-10-CM

## 2015-09-22 LAB — CBC
Hematocrit: 35 % (ref 34–45)
Hemoglobin: 11.6 g/dL (ref 11.2–15.7)
MCH: 29 pg/cell (ref 26–32)
MCHC: 33 g/dL (ref 32–36)
MCV: 87 fL (ref 79–95)
Platelets: 260 10*3/uL (ref 160–370)
RBC: 4 MIL/uL (ref 3.9–5.2)
RDW: 12.9 % (ref 11.7–14.4)
WBC: 5.1 10*3/uL (ref 4.0–10.0)

## 2015-09-22 LAB — PCMH DEPRESSION ASSESSMENT

## 2015-09-22 NOTE — Addendum Note (Signed)
Addended by: Sung AmabileSUIZO, Cordarrius Coad R on: 09/22/2015 12:21 PM     Modules accepted: Orders

## 2015-09-22 NOTE — Progress Notes (Signed)
~FAMILY MEDICINE~      SUBJECTIVE     Helen Morrison is a 30 y.o. female who presents for Knee Pain    Bilateral knee pain - she comes in today with complaints of two weeks of increasing knee pain.  SHe states that it started randomly with no injury and has generalized pain in the knees.  She notes at times when she bends her knee there is a cracking sensation with only mild pain.  She has not noticed any increase in swelling of the knee, inability to walk, night time pain, weight loss, fevers/chills/night sweats, redness of the knees.  She mentions that she continues to have multiple generalized joint pains, which during her last visit lab work did return normal.      Migraines - she also comes in today with complaints of migraine headaches that have increased lately.  She admits that it started after she found out she was pregnant.  Prior to her pregnancy she rarely had a migraine and would have a mild dull headaches periodically.  She notes now having migraines a few times a week with a generalized daily headache.  She has only been using tylenol due to pregnancy but wonders what else she can use.  She does admit to having photo and phonophobia with mild nausea, no change in vision or auras, no change in hearing, no night time headaches, no fevers/chills/night sweats, no neck pain or rigidity, no extremity weakness.      Pain Intensity:   Pain    09/22/15 1118   PainSc:   6   PainLoc: Knee         Medications     Current Outpatient Prescriptions on File Prior to Visit   Medication Sig Dispense Refill    Prenatal Vit-Fe Fumarate-FA (PRENATAL FORMULA) 27-1 MG tablet Take 1 tablet by mouth daily 30 tablet 5    ergocalciferol (DRISDOL) 50000 UNIT capsule Take 1 capsule (50,000 Units total) by mouth once a week 4 capsule 1     No current facility-administered medications on file prior to visit.        Patient's medications were reviewed and updated as appropriate in eRecord.    Allergies     Allergies   Allergen  Reactions    Pcn [Penicillins] Swelling       Patient's allergies were reviewed and updated as appropriate in eRecord.       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    ROS   GEN:  denies fever/chills, night sweats   HEENT:  denies vision/hearing changes, sore throat, or swollen glands  CV:  denies chest pain, palpitations  PULM:  denies wheezing, cough  GI:  denies nausea/vomiting, diarrhea  MSK:  As above  NEURO:  Positive headache, no dizziness/vertigo, no weakness    OBJECTIVE   PHYSICAL EXAM  Visit Vitals    BP 118/72 (BP Location: Right arm, Patient Position: Sitting, Cuff Size: adult)    Pulse 91    Temp 37.3 C (99.2 F) (Temporal)    Resp 18    Ht 1.702 m ( )    Wt 91.2 kg (201 lb)    LMP 08/09/2015  SpO2 100%    BMI 31.48 kg/m2     GEN: Well appearing, NAD  Eyes: PERRLA, conjunctiva clear, lids normal  Neck: Trachea midline, no thyromegaly, neck supple, no masses  PULM: Clear to auscultation, no increased work of breathing  CV: Normal S1,S2 without murmur or gallops, no carotid bruit  Neurologic: cranial nerves II through XII intact  MSK: bilateral knees - normal appearance with no swelling/deformity/erythema, no effusion, no pain on palpation along joint lines or patella, negative Lachman's and posterior drawer signs, negative varus and valgus stress, normal gait  Psych: normal mood and affect    ASSESSMENT / PLAN     Helen Morrison was seen today for knee pain.    Diagnoses and all orders for this visit:    Arthralgia, unspecified joint  -    She comes in today with on going complaints of generalized arthralgias especially in the knee for the past two weeks.  Her examination is overall normal and with her being pregnant I highly suggested we avoid xray at this time as this  would be low yield and would impose risks to the fetus.  She was disappointed in this but did seem to understand.  I think for now she will see PT for her knees though I do think she needs to see pain management for evaluation of her chronic pain as her past lab work and evaluation was negative.    -     CBC; Future  -     AMB REFERRAL TO PHYSICAL/ OCCUPATIONAL THERAPY  -     AMB REFERRAL TO PAIN TREATMENT    Migraines  -    She comes in today with increasing frequency of migraine headaches since becoming pregnant.  At this point there is not a great deal of therapy that can be used to help and suggested she continue with tylenol PRN.  I will also refer her to neurology for other suggestions that may help during her pregnancy.    -     AMB REFERRAL TO NEUROLOGY    Vitamin D deficiency  -     Vitamin D; Future    Return if symptoms worsen or fail to improve.    Signed: Drue FlirtSurag Suresh Sabreena Vogan, MD on 09/22/2015 at 11:43 AM

## 2015-09-25 ENCOUNTER — Other Ambulatory Visit: Payer: Self-pay | Admitting: Primary Care

## 2015-09-25 DIAGNOSIS — E559 Vitamin D deficiency, unspecified: Secondary | ICD-10-CM

## 2015-09-25 LAB — VITAMIN D
25-OH VIT D2: <4 ng/mL
25-OH VIT D3: 26 ng/mL
25-OH Vit Total: 26 ng/mL — ABNORMAL LOW (ref 30–60)

## 2015-09-26 ENCOUNTER — Ambulatory Visit: Payer: Self-pay

## 2015-09-26 DIAGNOSIS — Z3200 Encounter for pregnancy test, result unknown: Secondary | ICD-10-CM

## 2015-09-26 DIAGNOSIS — Z3201 Encounter for pregnancy test, result positive: Secondary | ICD-10-CM

## 2015-09-30 ENCOUNTER — Encounter: Payer: Self-pay | Admitting: Primary Care

## 2015-10-08 ENCOUNTER — Encounter: Payer: Self-pay | Admitting: Primary Care

## 2015-10-08 ENCOUNTER — Ambulatory Visit: Payer: Self-pay | Admitting: Rehabilitative and Restorative Service Providers"

## 2015-10-08 DIAGNOSIS — M25561 Pain in right knee: Secondary | ICD-10-CM

## 2015-10-08 DIAGNOSIS — M25562 Pain in left knee: Secondary | ICD-10-CM

## 2015-10-08 NOTE — Progress Notes (Signed)
Department of Physical Medicine & Rehabilitation  Physical Therapy Initial Assessment      History/Subjective    Diagnosis:  Bilateral knee pain    Referring practitioner:  Drue Flirt, MD    Onset date of symptoms:  09/09/15    Mechanism of injury:  No specific injury or trauma    Work Status:    No demands/irritations    Sports/Activities/Functional limitations:   Standing, bending, walking, stair negotiation, no sports/hobbies; jogging    Past Medical History:   Diagnosis Date    Abnormal Pap smear     last negative 04/2012    Anemia     Migraines     Scoliosis     Vitamin D deficiency      Past Surgical History:   Procedure Laterality Date    no fam hx of anes complic      as of 10/16/2012    no hx of anes complic      as of 10/16/2012     Comorbidities affecting treatment/recovery:   Obesity   Pregnant    Multiple joint pains - patient reports pain across "all my joints" of late    Personal factors affecting treatment/recovery:   none identified    Pain:   Today: 2/10   Best: 2/10   Worst: 9/10      Location:  Bilateral knees    Objective    Observation: Obese female in no distress at rest, cooperative; patient expresses some anxiety about her knee, concerned that no imaging has been done and "how do we know there isn't something wrong" - patient advised on her symptoms and reassured    Palpation: No specific tenderness to palpation or crepitus noted, however patient reports crepitus intermittently      KNEE ROM Left Right   Flexion WFL t/o WFL t/o   Extension         KNEE STRENGTH Left Right   Flexion 5 5   Extension 5 5   HIP STRENGTH     Adduction 5 5   Abduction 4 4   Flexion 4 4   Extension 4 4   ANKLE STRENGTH     Dorsiflexion 5 5   Plantarflexion 5 5       KNEE SPECIAL TESTS Left Right   Anterior Drawer - -   Posterior Drawer - -   Lachman's -   -   McMurray - -   Varus - -   Valgus - -          Gait: Unremarkable      Assessment: Patient presents with bilateral knee pain consistent with  patellofemoral syndrome, no frank instability or structural defects readily apparent on exam.  Patient is currently pregnant and reports pain "in all my joints" for quite some time now, unclear if more systemic process contributing to her knee pain.  PT to improve pain, mobility and function indicated progressing to HEP when independent.    Rehab potential/prognosis: good  Patient's understanding: good    Patient's Goal:  Become pain free  Patient complexity:  low level as indicated by above personal factors, environmental factors and comorbidities in addition to their impairments found on physical exam.      Plan  Plan of Care: Appropriate for PT    PT interventions: AROM/PROM/Therapeutic exercise, Balance activites, Closed chain activites, Cold, Flexibility, General conditioning, Heat, Home exercise program instruction, Manual therapy, Patient/Family Education, Postural training/body Curator education, Strengthening, Therapeutic Activities    PT  frequency:  Once a week    PT duration: 4-8 weeks      Short term goals (4 weeks):  1. Initiate HEP  2. Independent with home thermal agents  3. Tolerate >= 10 minutes standing without increased pain    Long term goals (8 weeks):  1. Independent with final HEP  2. Ambulate community distances without increased knee pain  3. Negotiate stairs reciprocally without increased knee pain  4. 5/5 BLE strength      Thank you for the referral.  If you have any questions and/or concerns, please feel free to contact me at [phone].    Tressie Ellisonald Zenas Santa, PT, DPT

## 2015-10-08 NOTE — Progress Notes (Signed)
Physical Therapy Exercise Flowsheet:  *Please refer to Physical Therapy Daily Flowsheet for further details of this session.*       10/08/15 1600   Lumbar/Abdominal Exercises   Lumbar/Abdominal Exercises Yes   Bridges w/ Pillow Squeeze Comment HEP   Hip Exercises   Hip Exercises Yes   Hip Abduction Clam, Sidelying Comment HEP   Hip Extension, Prone Comment HEP   Knee Exercises   Knee Exercises Yes   Hamstring Set Comment HEP   Quadriceps Set Comment HEP   Straight Leg Raise Comment HEP       Tressie Ellisonald Gabriellah Rabel, PT, DPT

## 2015-10-08 NOTE — Progress Notes (Signed)
Physical Therapy Daily Flowsheet:  *Please see Physical Therapy Exercise Flowsheet for details regarding exercises completed this session.*       10/08/15 1500   Overview   Diagnosis Bilateral knee pain   Insurance Medicaid   Script Date 09/22/15   Visit # 1   Pain Assessment   Pain X   0-10 Scale 2   Patient Education   Patient Education Yes   Educated in disease process Yes   Educated in home exercise program Yes   Educated in use of thermal agents Yes   Time Calculation   PT Timed Codes 0   PT Untimed Codes 30   PT Unbilled Time 0   PT Total Treatment 30   Charges   CC Charges PT Eval Low complexity - code [id number]       Tressie Ellisonald Jayleana Colberg, PT, DPT

## 2015-10-10 ENCOUNTER — Ambulatory Visit: Payer: Self-pay

## 2015-10-10 ENCOUNTER — Telehealth: Payer: Self-pay

## 2015-10-10 VITALS — BP 118/65 | HR 74 | Ht 67.0 in | Wt 197.0 lb

## 2015-10-10 DIAGNOSIS — Z3480 Encounter for supervision of other normal pregnancy, unspecified trimester: Secondary | ICD-10-CM

## 2015-10-10 NOTE — Telephone Encounter (Signed)
Patient was scheduled too far out for NOB. LMOVM for patient to return call to rescheduled appointment. Patient needs appointment 10/17/15-11/07/15.    If patient calls back please rescheduled for above timeline for NOB, no need to forward to Nurse pool.

## 2015-10-10 NOTE — Progress Notes (Signed)
MRN: [id number]    Helen Morrison is a 30 y.o. G3P1011. She is now [redacted]w[redacted]d weeks, and requesting prenatal care. Estimated Date of Delivery: 05/15/16     Dating established by: (U/s or LMP) 08/09/15    Patient reports fatigue and nausea.    (Please note ethnicity African American)    Medical / Surgical history:   Past Medical History:   Diagnosis Date    Abnormal Pap smear     last negative 04/2012    Anemia     Migraines     Scoliosis     Vitamin D deficiency      Allergies as of 10/10/2015 - Up to Date 10/10/2015   Allergen Reaction Noted    Pcn [penicillins] Swelling 03/23/2012       OB History     Gravida Para Term Preterm AB TAB SAB Ectopic Multiple Living    0 1 0 1 0 0 1          Barriers to education/learning assessment    Person assessed: patient    Factors that affect learning:   Physical: Vision Impairment  Emotional: None  Misc:None    Patient has support system for learning:yes  spouse / significant other, name: Kevoy Banton    Ability/readiness to learn (ability to grasp concepts, respond to questions, follow directions)  Comprehension: Good   Motivation: Ask questions  Preferred learning method: Discussion, Video, Demonstration and Visualization     Educational background    Highest level of education completed:    Some College    Pregnancy education folder given. Yes  (indicate for first or second folder)       Nutritional/Dental Risk Assessment    Obesity (>30 BMI)   Body mass index is 30.85 kg/(m^2).       Patient requests nutritional consultation No    Dental visit in the last 12 months? No    Dental problems? No            Infection History:    - History of Chicken pox:  Yes   - Has patient been vaccinated:  No   - Does the patient own a cat?  No   - if yes, was the patient educated?  Yes   - History of TB or positive PPD?   No   - History of STD's:  no       Social History:  Patient is currently employed as a Compliance Analyst.     Patient has a stable home environment. Lives with Son,  Kyse. Father of the baby is involved with the pregnancy. Name: Joycelyn Schmid    Any history of domestic violence in the past year?   No        Do you feel unsafe with your partner? No    Any issues with transportation, food, housing, financial assistance, childcare, clothing, baby supplies? No    Prior CPS involvement with patient or FOBs other children? No   Do you feel you need to see social work? No       If yes, referral to SW indicated.  Social work referral was not made.     Transportation:  How will you get to your appointments?  Drives self  (Patient will drive/Family Member/Bus)     Educated on Medicaid bus pass? Yes    Provided phone number for Medicaid bus pass request? No      Plan:  New OB appointment was scheduled. Referrals made to  WIC.     The patient consented to testing for HIV testing, first trimester screen and CDS. The patient declined testing for Nothing.First trimester teaching was done.     HIV pretest counseling for less than 8 minutes.    The patient verbally indicated she understands the teaching and plan.     Healthcare Proxy addressed. SH 1100 MR form given and reviewed.   Patient to return form upon completion.     Currently enrolled with MyChart. Yes    Assessment by: Lupe CarneyLisa M Kateryna Grantham, RN 10/10/2015

## 2015-10-13 NOTE — Telephone Encounter (Signed)
Called patient rescheduled appointment with blue team.

## 2015-10-15 ENCOUNTER — Ambulatory Visit: Payer: Self-pay

## 2015-10-24 ENCOUNTER — Ambulatory Visit: Payer: Self-pay

## 2015-10-24 DIAGNOSIS — M25562 Pain in left knee: Secondary | ICD-10-CM

## 2015-10-24 DIAGNOSIS — M25561 Pain in right knee: Secondary | ICD-10-CM

## 2015-10-24 NOTE — Progress Notes (Signed)
Physical Therapy Daily Flowsheet:  *Please see Physical Therapy Exercise Flowsheet for details regarding exercises completed this session.*       10/24/15 1600   Overview   Diagnosis Bilateral knee pain   Insurance Medicaid   Script Date 10/08/15   Visit # 2   Pain Assessment   Pain Denies   0-10 Scale 0   Patient Education   Patient Education Yes   Educated in disease process Yes   Educated in home exercise program Yes   Educated in use of thermal agents Yes   Time Calculation   PT Timed Codes 30   PT Untimed Codes 0   PT Unbilled Time 0   PT Total Treatment 30   Charges   CC Charges Therapeutic Exercises - code [id number] (15 minutes x2)   Chatham Howington A Cassatt, PTA

## 2015-10-24 NOTE — Progress Notes (Signed)
Physical Therapy Exercise Flowsheet:  *Please refer to Physical Therapy Daily Flowsheet for further details of this session.*       10/24/15 1600   Gym Equipment Exercises   Nustep Comments 5 min L3 tol well   Total time 5 min   Lumbar/Abdominal Exercises   Bridges w/ Pillow Squeeze Comment x10 5 sec hold tol well   Hip Exercises   Hip Abduction Clam, Sidelying Comment B LE x10 5 sec hold tol well   Knee Exercises   Hamstring Set Comment B LE x10 5 sec hold tol well   Quadriceps Set Comment B LE x10 5 sec hold tol well   Straight Leg Raise Comment B LE x10 5 sec hold tol well   additional exercise sit to stand with no sitting x15 tol well   Helen Morrison, PTA

## 2015-10-31 ENCOUNTER — Other Ambulatory Visit: Payer: Self-pay | Admitting: Obstetrics and Gynecology

## 2015-10-31 ENCOUNTER — Ambulatory Visit: Payer: Self-pay | Admitting: Obstetrics and Gynecology

## 2015-10-31 ENCOUNTER — Encounter: Payer: Self-pay | Admitting: Obstetrics and Gynecology

## 2015-10-31 VITALS — BP 126/73 | HR 85 | Ht 67.0 in | Wt 198.0 lb

## 2015-10-31 DIAGNOSIS — E669 Obesity, unspecified: Secondary | ICD-10-CM

## 2015-10-31 DIAGNOSIS — Z3491 Encounter for supervision of normal pregnancy, unspecified, first trimester: Secondary | ICD-10-CM

## 2015-10-31 DIAGNOSIS — Z8759 Personal history of other complications of pregnancy, childbirth and the puerperium: Secondary | ICD-10-CM

## 2015-10-31 DIAGNOSIS — Z8669 Personal history of other diseases of the nervous system and sense organs: Secondary | ICD-10-CM

## 2015-10-31 DIAGNOSIS — Z3A11 11 weeks gestation of pregnancy: Secondary | ICD-10-CM

## 2015-10-31 LAB — POCT URINALYSIS DIPSTICK
Glucose,UA POCT: NORMAL
Ketones,UA POCT: NEGATIVE
Leuk Esterase,UA POCT: 1 — AB
Lot #: 17822902
Nitrite,UA POCT: NEGATIVE
PH,UA POCT: 6 (ref 5–8)
Protein,UA POCT: NEGATIVE mg/dL

## 2015-10-31 LAB — DRUG SCREEN CHEMICAL DEPENDENCY, URINE
Amphetamine,UR: NEGATIVE
Benzodiazepinen,UR: NEGATIVE
Cocaine/Metab,UR: NEGATIVE
Opiates,UR: NEGATIVE
THC Metabolite,UR: NEGATIVE

## 2015-10-31 MED ORDER — FOLIC ACID 400 MCG PO TABS *I*
400.0000 ug | ORAL_TABLET | Freq: Every day | ORAL | 9 refills | Status: AC
Start: 2015-10-31 — End: 2016-08-11

## 2015-10-31 NOTE — Progress Notes (Signed)
[facility]   New Obstetric Patient H&P    HISTORY OF PRESENT ILLNESS  Helen Morrison presents to establish care for this pregnancy.  This is her second pregnancy with her partner Helen Morrison.  Their son is 3, she had an uncomplicated pregnancy with him.      She has been feeling well - reports some nausea which is improving and endorses fatigue.  No vomiting.  Appetite OK.    PAST MEDICAL HISTORY   Past Medical History:   Diagnosis Date    Abnormal Pap smear     last negative 04/2012    Anemia     Migraines     Scoliosis     Vitamin D deficiency        PAST SURGICAL HISTORY  Past Surgical History:   Procedure Laterality Date    no fam hx of anes complic      as of 10/16/2012    no hx of anes complic      as of 10/16/2012       MEDICATIONS    Current Outpatient Prescriptions:     Prenatal Vit-Fe Fumarate-FA (PRENATAL FORMULA) 27-1 MG tablet, Take 1 tablet by mouth daily, Disp: 30 tablet, Rfl: 5    folic acid (FOLVITE) 400 MCG tablet, Take 1 tablet (400 mcg total) by mouth daily, Disp: 30 tablet, Rfl: 9    ergocalciferol (DRISDOL) 50000 UNIT capsule, Take 1 capsule (50,000 Units total) by mouth once a week, Disp: 4 capsule, Rfl: 1    ALLERGIES  Allergies   Allergen Reactions    Pcn [Penicillins] Swelling       OBSTETRIC HISTORY  Obstetric History    G3   P1   T1   P0   A1   TAB0   SAB1   E0   M0   L1       # Outcome Date GA Lbr Len/2nd Weight Sex Delivery Anes PTL Lv   3 Current            2 Term 10/17/12 [redacted]w[redacted]d 18:38 / 03:13 2829 g (6 lb 3.8 oz) M Vag-Vacuum EPIDURAL-,Pud,Local N Y      Name: Burmester,BOY      Complications: Chorioamnionitis      Apgar1:  8                Apgar5: 9   1 SAB 2011                  GYNECOLOGIC HISTORY  LMP: Patient's last menstrual period was 08/09/2015 (exact date).   Menstrual Hx:   - Regular every month, lasting a week, minimal cramping and PMS  - Intermenstrual bleeding: absent  H/o abnormal pap smears:  Yes in [place]   Sexual Hx:  - Patient identifies as a woman  - Sexual  preference: female partner(s)  - Current partners: 1 - "Kevoy"  - H/o STIs: remote h/o chlamydia   - Sexual complaints:  Denies pain with intercourse, bleeding with intercourse, loss of libido, difficulty with desire, arousal or orgasm    FAMILY HISTORY  Family History   Problem Relation Age of Onset    Hypertension Mother     Diabetes Maternal Aunt     Diabetes Paternal Aunt     Arthritis Paternal Aunt        SOCIAL HISTORY  Tobacco use: no  Alcocol use: No  Illicit drug use: No  Exercise: Yes regularly  Works as Visual merchandiser.  Lives with son  and partner, denies any recent changes at home.  Denies history of any physical, emotional or sexual abuse.     IMMUNIZATIONS  Influenza: recommended vaccination during pregnancy during flu season  TDAP: Recommend vaccination between 27-[redacted] weeks gestation for maximum fetal benefit  Rubella: yes  Varicella: yes   Hepatitis B: yes  HPV: yes     REVIEW OF SYSTEMS   Gen: Denies unintended weight loss (>10#), weight gain(>10#), night sweats, marked  fatigue  HEENT: Denies sinus problems, headaches, hearing loss, nosebleeds  CV: Denies chest pain, irregular heartbeat, fainting spells, swelling of the feet or ankles  Resp: Denies chronic cough, frequent cough, SOB  Breast: Denies breast pain, lumps, nipple discharge, change in skin color  JWJ:XBJYNWAbd:Denies difficulty swallowing, frequent diarrhea, frequent constipation, blood in stool  GU: Denies urinary frequency, urinary urgency, dysuria, incontinence  GYN: Denies vaginal dryness, bulge from vagina, loss of sex drive  MSK: Denies joint stiffness/swelling, muscle weakness, muscle pain, joint pain, back pain  Neuro: Denies lightheadedness, dizziness, numbness/tingling in extremities, problems with coordination, tremors  Skin: Denies rash, hair loss  Psych: Denies depression, stress/anxiety      PHYSICAL EXAM  Vitals:    10/31/15 1449   BP: 126/73   Pulse: 85   Weight: 89.8 kg (198 lb)   Height: 1.702 m (5\' 7" )       General -  Pleasant, well-appearing pregnant female, in NAD  HEENT - Normocepahlic, atraumatic. Thyroid normal to palpation and inspection.  Resp - Clear to auscultation bilaterally. Normal respiratory effort.  Breast - Symmetric, normal contour, skin normal, no palpable masses or nodules, nipples everted without discharge, no palpable axillary lymphadenopathy.   CV - normal rate, regular rhythm, normal S1, S2, no murmurs  Abdomen - soft, nondistended, nontender   Pelvic - Normal-appearing external genitalia, vagina and cervix.  11-week sized uterus with normal contour, no adnexal tenderness or fullness appreciated.  Physiologic vaginal discharge.  No CMT.  Extremities - No edema, no tenderness.    ASSESSMENT/PLAN  30 y.o. G3P1011 at 5319w6d by LMP c/w 6 week USN.    Pregnancy Risks  H/o VAVD for maternal exhaustion (chorioamnionitis, pushed 2 hrs, 6#4oz infant)  Obesity (BMI 30)    1. Prenatal Care  - EDD: 05/15/16 by LMP c/w 6 week USN  - Pap collected today  - Discussed options for aneuploidy screening - including FTS, NIPT, MSAFP patient accepts FTS, MSAFP (what she did last time), consent signed in patient's chart   - Discussed importance of balanced nutrition during pregnancy.  Recommended daily supplementation with prenatal vitamin.    - Discussed exercise and healthy weight gain during pregnancy.  Given patient's BMI 30 recommended 11 - 20 lb weight gain.    <> Prenatal profile, HIV, lead, CF, HgbElec  <> GC/CT, pap  <> UCx, Utox  <> flu vaccination (during flu season)  <> genetic screening - FTS, MSAFP (consent obtained)  <> Anatomic USN (16-22 weeks)  <> Glucola, CBC (24-28 weeks)  <> TDAP vaccination (27-36 weeks)  <> GBS (36 weeks)    Return to clinic in 4 weeks.    45 minute visit with >50% time spent face to face counseling and coordination of care    Webb SilversmithKristen Harl Wiechmann MD  OB/GYN Attending Physician  Pager # [phone]  10/31/2015  3:22 PM

## 2015-11-01 LAB — AEROBIC CULTURE: Aerobic Culture: 0

## 2015-11-02 ENCOUNTER — Encounter: Payer: Self-pay | Admitting: Obstetrics and Gynecology

## 2015-11-03 LAB — N. GONORRHOEAE DNA AMPLIFICATION: N. gonorrhoeae DNA Amplification: 0

## 2015-11-03 LAB — CHLAMYDIA PLASMID DNA AMPLIFICATION: Chlamydia Plasmid DNA Amplification: 0

## 2015-11-03 LAB — TRICHOMONAS DNA AMPLIFICATION: Trichomonas DNA amplification: 0

## 2015-11-04 LAB — HPV DNA PROBE WITH CYTOLOGY
HPV Other High Risk: POSITIVE
HPV Type 16: NEGATIVE
HPV Type 18: NEGATIVE

## 2015-11-07 ENCOUNTER — Ambulatory Visit: Payer: Self-pay

## 2015-11-07 DIAGNOSIS — M25561 Pain in right knee: Secondary | ICD-10-CM

## 2015-11-07 DIAGNOSIS — M25562 Pain in left knee: Secondary | ICD-10-CM

## 2015-11-07 NOTE — Progress Notes (Signed)
Physical Therapy Daily Flowsheet:  *Please see Physical Therapy Exercise Flowsheet for details regarding exercises completed this session.*       11/07/15 1600   Overview   Diagnosis Bilateral knee pain   Insurance Medicaid   Script Date 10/08/15   Visit # 3   Additional Comments PN   Pain Assessment   Pain Denies   0-10 Scale 0   Functional Task   Able to do the following: Sleeping;Static sitting;Static standing;Table top ADL's;Dressing;Driving   Additional Comments less pain daily, intermitten pain   difficulty with the following: Stairs;Walking;Squatting;Lifting   Additional Comments avoids lifting due to knee pain, stairs because of knee pain   ROM   Right Lower Extremity Full AROM RLE   Left Lower Extremity Full AROM LLE   STRENGTH   R Hip Flexion  4+/5   R Hip Extension  4+/5   R Hip Abduction  4+/5   R Hip Adduction  4+/5   R Knee Flexion  4+/5   R Knee Extension  4+/5   L Hip Flexion  4+/5   L Hip Extension  4+/5   L Hip Abduction  4+/5   L Hip Adduction  4+/5   L Knee Flexion  4+/5   L Knee Extension  4+/5   Modalities   Cold Pack Yes   Time ice to go   Patient Education   Patient Education Yes   Educated in disease process Yes   Educated in home exercise program Yes   Educated in use of thermal agents Yes   Time Calculation   PT Timed Codes 30   PT Untimed Codes 0   PT Total Treatment 30   Charges   CC Charges Therapeutic Exercises - code [phone] (15 minutes x2)   Mattew Chriswell A Cassatt, PTA

## 2015-11-07 NOTE — Progress Notes (Signed)
Physical Therapy Exercise Flowsheet:  *Please refer to Physical Therapy Daily Flowsheet for further details of this session.*       11/07/15 1600   Gym Equipment Exercises   Sci Fit/Sci Rex Comments 6 min L4 tol well   Total time 6 min   Lumbar/Abdominal Exercises   Bridges w/ Pillow Squeeze Comment x10 5 sec hold tol well   Knee Exercises   Straight Leg Raise Comment B LE x10 5 sec hold tol well   additional exercise BOSU forward. lateral and step overs x10 B LE tol well   additional exercise sit to stand with no sitting x15 tol well   Helen Morrison A Cassatt, PTA

## 2015-11-08 LAB — GYN CYTOLOGY

## 2015-11-10 ENCOUNTER — Encounter: Payer: Self-pay | Admitting: Obstetrics and Gynecology

## 2015-11-10 DIAGNOSIS — R87612 Low grade squamous intraepithelial lesion on cytologic smear of cervix (LGSIL): Secondary | ICD-10-CM | POA: Insufficient documentation

## 2015-11-11 ENCOUNTER — Ambulatory Visit: Payer: Self-pay

## 2015-11-11 DIAGNOSIS — M25562 Pain in left knee: Secondary | ICD-10-CM

## 2015-11-11 DIAGNOSIS — M25561 Pain in right knee: Secondary | ICD-10-CM

## 2015-11-11 NOTE — Progress Notes (Signed)
Physical Therapy Exercise Flowsheet:  *Please refer to Physical Therapy Daily Flowsheet for further details of this session.*       11/11/15 1700   Gym Equipment Exercises   Sci Fit/Sci Rex Comments 5 min L4 tol well   Total time 5 min   Hip Exercises   Hip Abduction, Standing, Theraband Comment L3 B LE x10 5 sec hold tol well   Hip Extension, Standing, Theraband Comment L3 B LE x10 5 sec hold tol well   Hip Flexion, Standing, Theraband Comment L3 B LE x10 5 sec hold tol well   additional exercise heel raises/ toe raises x15 tol well   Total time 10 min   Knee Exercises   additional exercise BOSU forwardl and step overs x20 B LE tol well   additional exercise squats in // bars x10 tol well   Total time 10 min   Girtrude Enslin A Cassatt, PTA

## 2015-11-11 NOTE — Progress Notes (Signed)
Physical Therapy Daily Flowsheet:  *Please see Physical Therapy Exercise Flowsheet for details regarding exercises completed this session.*     11/11/15 1700   Overview   Diagnosis Bilateral knee pain   Insurance Medicaid   Script Date 11/07/15   Visit # 4   Pain Assessment   Pain Denies   0-10 Scale 0   Patient Education   Patient Education Yes   Educated in disease process Yes   Educated in home exercise program Yes   Educated in use of thermal agents Yes   Time Calculation   PT Timed Codes 25   PT Untimed Codes 0   PT Total Treatment 25   Charges   CC Charges Therapeutic Exercises - code 0204 (15 minutes x2)     Carren Blakley A Cassatt, PTA

## 2015-11-13 NOTE — Progress Notes (Addendum)
Department of Physical Medicine & Rehabilitation  Physical Therapy Progress Note    HISTORY:  Diagnosis:    Diagnosis: Bilateral knee pain  Date of Surgery/Onset Date of Symptoms:        09/09/15  Referring Provider:        Drue FlirtPatel, Surag Suresh, MD  Frequency:    Once a week  Attendance:    Consistent  Patient's compliance with therapy and home exercise program:      good   Treatment:    AROM/PROM/Therapeutic exercise, Balance activities, Closed chain activites, Cold pack, Flexibility, Gait training/Functional activities, General conditioning, Heat, Home exercise program instructions/Patient education, Patient/Family Education, Postural training/body Curatormechanic education, Strengthening    SUBJECTIVE:  Pain:    Improved     0-10 Scale: 0    OBJECTIVE:  ROM:    ROM   Right Lower Extremity: Full AROM RLE   Left Lower Extremity: Full AROM LLE  Strength:    STRENGTH   R Hip Flexion : 4+/5   R Hip Extension : 4+/5   R Hip Abduction : 4+/5   R Hip Adduction : 4+/5   R Knee Flexion : 4+/5   R Knee Extension : 4+/5   L Hip Flexion : 4+/5   L Hip Extension : 4+/5   L Hip Abduction : 4+/5   L Hip Adduction : 4+/5   L Knee Flexion : 4+/5   L Knee Extension : 4+/5   Level 3 Theraband Exercises  Function:    Improved   Functional Tasks:     Improving with/Able to do the following:: Sleeping, Static sitting, Static standing, Table top ADL's, Dressing, Driving,    Additional Comments: less pain daily, intermittent pain,not limited with sitting, not limited with standing      Still difficulty with the following:: Stairs, Walking, Squatting, Lifting,   Additional Comments: avoids lifting due to knee pain, stairs because of knee pain, notes poor endurance when walking for a long time about 20 minutes       Patient Education   Patient Education: Yes   Educated in disease process: Yes   Educated in home exercise program: Yes   Educated in use of thermal agents: Yes       ASSESSMENT:  Patient is a 30 yo female who has  made steady progress thus far in therapy with all short term goals achieved.  Patient will benefit from ongoing skilled therapy to improve hip, knee and ankle strengthening to perform functional mobility activities at home.     Progress Towards Previous Goals:    good     Previous Goals:    Short term goals - achieved all goals.   1. Initiate HEP-ACHIEVED  2. Independent with home thermal agents-ACHIEVED  3. Tolerate >= 10 minutes standing without increased pain-ACHIEVED    Progress Towards Goals: Good    Long term goals (4 weeks): ongoing and appropriate.   1. Independent with final HEP  2. Ambulate community distances without increased knee pain  3. Negotiate stairs reciprocally without increased knee pain  4. 5/5 BLE strength      PLAN:  Plan of Care:     Continue current PT program, progressing as tolerated    Thank you for the referral.  If you have any questions and/or concerns, please feel free to contact me at [phone].      Helen Morrison PTA  Helen HallLindsay Zayah Morrison, PT    Phone # [phone]  Fax # [phone]

## 2015-11-14 ENCOUNTER — Ambulatory Visit: Payer: Self-pay

## 2015-11-14 ENCOUNTER — Other Ambulatory Visit: Payer: Self-pay

## 2015-11-14 ENCOUNTER — Ambulatory Visit: Payer: Self-pay | Admitting: Reproductive Endocrinology and Infertility

## 2015-11-14 DIAGNOSIS — Z3A11 11 weeks gestation of pregnancy: Secondary | ICD-10-CM

## 2015-11-21 ENCOUNTER — Ambulatory Visit: Payer: Self-pay

## 2015-11-28 ENCOUNTER — Ambulatory Visit: Payer: Self-pay

## 2015-11-28 ENCOUNTER — Ambulatory Visit: Payer: Self-pay | Admitting: Obstetrics and Gynecology

## 2015-11-28 VITALS — BP 127/59 | HR 90 | Ht 67.01 in | Wt 206.0 lb

## 2015-11-28 DIAGNOSIS — Z3481 Encounter for supervision of other normal pregnancy, first trimester: Secondary | ICD-10-CM

## 2015-11-28 DIAGNOSIS — Z3A15 15 weeks gestation of pregnancy: Secondary | ICD-10-CM

## 2015-11-28 DIAGNOSIS — R87612 Low grade squamous intraepithelial lesion on cytologic smear of cervix (LGSIL): Secondary | ICD-10-CM

## 2015-11-28 DIAGNOSIS — J309 Allergic rhinitis, unspecified: Secondary | ICD-10-CM

## 2015-11-28 LAB — POCT URINALYSIS DIPSTICK
Blood,UA POCT: NEGATIVE
Glucose,UA POCT: NORMAL
Leuk Esterase,UA POCT: 1 — AB
Lot #: 17822902
Nitrite,UA POCT: NEGATIVE
PH,UA POCT: 7 (ref 5–8)
Protein,UA POCT: NEGATIVE mg/dL

## 2015-11-28 MED ORDER — CETIRIZINE HCL 10 MG PO TABS *I*
10.0000 mg | ORAL_TABLET | Freq: Every day | ORAL | 11 refills | Status: AC
Start: 2015-11-28 — End: 2016-11-27

## 2015-11-28 MED ORDER — FLUTICASONE FUROATE 27.5 MCG/SPRAY NA SUSP *A*
2.0000 | Freq: Every day | NASAL | 1 refills | Status: AC
Start: 2015-11-28 — End: ?

## 2015-12-02 ENCOUNTER — Encounter: Payer: Self-pay | Admitting: Obstetrics and Gynecology

## 2015-12-02 NOTE — Progress Notes (Signed)
[facility]   Routine OB Check    Doing okay.  Denies cramping, contractions, LOF, VB and reports normal fetal movement.  Reports allergies have been terrible.  Couldn't remember what she could take so she hasn't been taking anything.  Knows that in the past cetrizine was helpful and flonase was great for nasal symptoms.     PHYSICAL EXAM:  Vitals:    11/28/15 1435   BP: 127/59   Pulse: 90   Weight: 93.4 kg (206 lb)   Height: 1.702 m (5' 7.01")       Gen: Well-appearing pregnant female in NAD  Abd: Gravid, no fundal tenderness    Doptones: 156 bpm    ASSESSMENT/PLAN:  30 y.o. G3P1011 at 1750w6d by LMP c/w 6 week USN.    Pregnancy Risks:  LSIL pap - needs PP colposcopy  Obesity - pre-preg BMI 30 - TWG 9# - addressed recommendation for 11-20# total again today   H/o VAVD (2014)  Scoliosis  H/o migraine headaches    1. Routine Prenatal Care  - Prenatal labs UTD   - genetic testing: FTS could not be completed (CRL measuring too far)    Ordered today:  <> consented for STS today   <> Anatomic USN (16-22 weeks)    Future:  <> Glucola, CBC (24-28 weeks)  <> TDAP vaccination (27-36 weeks)  <> GBS (36 weeks)    2. Allergic Rhinitis  - used cetrizine and fluticasone with good effect in the past  - reviewed safety data and prescribed both today    [facility]    Webb SilversmithKristen Irby Fails MD  OB/GYN Attending Physician  Pager # [phone]

## 2015-12-10 NOTE — Progress Notes (Signed)
11/21/15 1630   Overview   Diagnosis Bilateral knee pain   Insurance Medicaid   Script Date 11/07/15   Missed Visit Canceled   Additional Comments no reason   Charges   No Charge Code No Charge Code   Kavion Mancinas A Cassatt, PTA

## 2015-12-10 NOTE — Progress Notes (Signed)
11/28/15 1630   Overview   Diagnosis Bilateral knee pain   Insurance Medicaid   Script Date 11/07/15   Missed Visit Canceled   Additional Comments no reason   Charges   No Charge Code No Charge Code   Kiondre Grenz A Cassatt, PTA

## 2015-12-11 ENCOUNTER — Ambulatory Visit: Payer: Self-pay

## 2015-12-11 DIAGNOSIS — M25561 Pain in right knee: Secondary | ICD-10-CM

## 2015-12-11 DIAGNOSIS — M25562 Pain in left knee: Secondary | ICD-10-CM

## 2015-12-11 NOTE — Progress Notes (Signed)
Physical Therapy Daily Flowsheet:  *Please see Physical Therapy Exercise Flowsheet for details regarding exercises completed this session.*       12/11/15 1700   Overview   Diagnosis Bilateral knee pain   Insurance Medicaid   Script Date 12/11/15   Visit # 5   Additional Comments PN   Pain Assessment   Pain Denies   0-10 Scale 0   Patient Education   Patient Education Yes   Educated in disease process Yes   Educated in home exercise program Yes   Educated in use of thermal agents Yes   Time Calculation   PT Timed Codes 20   PT Untimed Codes 0   PT Unbilled Time 0   PT Total Treatment 20   Charges   CC Charges Therapeutic Exercises - code [phone] (15 minutes)   Carolanne Mercier A Cassatt, PTA

## 2015-12-12 NOTE — Progress Notes (Signed)
Physical Therapy Exercise Flowsheet:  *Please refer to Physical Therapy Daily Flowsheet for further details of this session.*       12/12/15 0900   Gym Equipment Exercises   Sci Fit/Sci Rex Comments 5 min L4 tol well   Total time 5 min   Hip Exercises   additional exercise reassesment for PN   Total time 10 min   Tamyra Fojtik A Cassatt, PTA

## 2015-12-12 NOTE — Progress Notes (Addendum)
Department of Physical Medicine & Rehabilitation  Physical Therapy Progress Note    HISTORY:  Diagnosis:    Diagnosis: Bilateral knee pain  Date of Surgery/Onset Date of Symptoms:        09/09/15  Referring Provider:        Drue FlirtPatel, Surag Suresh, MD  Frequency:    Once a week  Attendance:    Inconsistent, 2 missed weeks  Patient's compliance with therapy and home exercise program:      fair   Treatment:    AROM/PROM/Therapeutic exercise, Balance activities, Closed chain activites, Cold pack, Flexibility, General conditioning, Home exercise program instructions/Patient education, Patient/Family Education, Postural training/body Curatormechanic education, Strengthening    SUBJECTIVE:  Pain:    Improved     0-10 Scale: 0    OBJECTIVE:  ROM:    ROM   Right Lower Extremity: Full AROM RLE   Left Lower Extremity: Full AROM LLE  Strength:    STRENGTH   R Hip Flexion : 5/5   R Hip Extension : 5/5   R Hip Abduction : 5/5   R Hip Adduction : 5/5   R Knee Flexion : 5/5   R Knee Extension : 5/5   L Hip Flexion : 5/5   L Hip Extension : 5/5   L Hip Abduction : 5/5   L Hip Adduction : 5/5   L Knee Flexion : 5/5   L Knee Extension : 5/5   Level 3 Theraband Exercises  Function:    Improved   Functional Tasks:     Improving with/Able to do the following:: Sleeping, Static sitting, Static standing, Table top ADL's, Dressing, Driving, Walking,      feels knees have improved overall, no longer having pain with walking, sitting or standing. Does not feel limited by knee pain      Still difficulty with the following:: Stairs, Squatting, Lifting,   Additional Comments: knee pain when bening, bending to lift heavy objects,         Patient Education   Patient Education: Yes   Educated in disease process: Yes   Educated in home exercise program: Yes   Educated in use of thermal agents: Yes       ASSESSMENT:  Patient has achieved PT goals and will continue independently with HEP moving forward.    Long term goals (4 weeks):    1. Independent with final HEP-ACHIEVED  2. Ambulate community distances without increased knee pain-ACHIEVED  3. Negotiate stairs reciprocally without increased knee pain- NOT ACHIEVED  4. 5/5 BLE strength-ACHIEVED    Progress Towards Goals: Good    PLAN: Discharge PT    Stacie A. Cassatt PTA  Tressie Ellisonald Patrese Neal, PT, DPT

## 2015-12-16 ENCOUNTER — Ambulatory Visit: Payer: Self-pay

## 2015-12-16 DIAGNOSIS — Z3A15 15 weeks gestation of pregnancy: Secondary | ICD-10-CM

## 2015-12-18 ENCOUNTER — Encounter: Payer: Self-pay | Admitting: Obstetrics and Gynecology

## 2015-12-23 ENCOUNTER — Other Ambulatory Visit: Payer: Self-pay | Admitting: Obstetrics and Gynecology

## 2015-12-23 DIAGNOSIS — Z3482 Encounter for supervision of other normal pregnancy, second trimester: Secondary | ICD-10-CM

## 2015-12-25 ENCOUNTER — Ambulatory Visit: Payer: Self-pay

## 2015-12-25 DIAGNOSIS — Z3482 Encounter for supervision of other normal pregnancy, second trimester: Secondary | ICD-10-CM

## 2015-12-26 ENCOUNTER — Encounter: Payer: Self-pay | Admitting: Gastroenterology

## 2015-12-26 ENCOUNTER — Ambulatory Visit: Payer: Self-pay | Admitting: Reproductive Endocrinology and Infertility

## 2015-12-26 VITALS — BP 124/70 | HR 88 | Ht 67.0 in | Wt 207.0 lb

## 2015-12-26 DIAGNOSIS — Z3481 Encounter for supervision of other normal pregnancy, first trimester: Secondary | ICD-10-CM

## 2015-12-26 DIAGNOSIS — Z3A19 19 weeks gestation of pregnancy: Secondary | ICD-10-CM

## 2015-12-26 DIAGNOSIS — Z3491 Encounter for supervision of normal pregnancy, unspecified, first trimester: Secondary | ICD-10-CM

## 2015-12-26 DIAGNOSIS — Z3A11 11 weeks gestation of pregnancy: Secondary | ICD-10-CM

## 2015-12-26 DIAGNOSIS — Z8669 Personal history of other diseases of the nervous system and sense organs: Secondary | ICD-10-CM

## 2015-12-26 DIAGNOSIS — E559 Vitamin D deficiency, unspecified: Secondary | ICD-10-CM

## 2015-12-26 DIAGNOSIS — Z3A15 15 weeks gestation of pregnancy: Secondary | ICD-10-CM

## 2015-12-26 MED ORDER — BUTALBITAL-APAP-CAFFEINE 50-325-40 MG PO TABS *I*
1.0000 | ORAL_TABLET | Freq: Four times a day (QID) | ORAL | 0 refills | Status: DC | PRN
Start: 2015-12-26 — End: 2016-08-05

## 2015-12-26 NOTE — Patient Instructions (Signed)
Please have prenatal bloodwork completed.

## 2015-12-26 NOTE — Progress Notes (Signed)
Helen SaranShaunakay Clovis Morrison is a 30 y.o. G3P1011 who presents for routine Prenatal Visit at 5281w6d     Subjective:Helen Morrison is doing well.  She denies vaginal bleeding, discharge or LOF. She reports good fetal movement. She complains of 1-2 migraine headaches per week with no relief from acetaminophen.     OB History     Gravida Para Term Preterm AB Living    3 1 1  0 1 1    SAB TAB Ectopic Multiple Live Births    1 0 0 0           Pregnancy ROS:   LOF: None  VB: None  Fetal movement: Yes    Objective:    Vitals:    12/26/15 1457   BP: 124/70   Pulse: 88   Weight: 93.9 kg (207 lb)   Height: 1.702 m (5\' 7" )     Fundal Height: 20 cm  FHT: 147 bpm     Assessment:   30 y.o. G3P1011 at 1181w6d weeks gestation    Plan:    Problem   Supervision of Normal Pregnancy in First Trimester    30 y.o. G3P1011 at 37100w6d by LMP c/w 6 week USN.    Pregnancy Risks:  H/o VAVD  Obesity (Pre-preg BMI 30)  LSIL pap - needs colposcopy postpartum    1. Routine Prenatal Care  - UCx neg, UTox neg  - GC/CT/trich neg    <> Prenatal profile, HIV, lead, CF, HgbElec neg--reminded to have completed     <x> genetic screening -- too late for first trimester screen   - Anatomic USN completed: anterior placenta  <> Glucola, CBC (24-28 weeks)  <> TDAP vaccination (27-36 weeks)  <> GBS (36 weeks)     History of Migraine Headaches    [place]  [place]    Endorses some tension headaches this pregnancy, but no migraines.     Discussed migraine headache management.  [place].  Referral placed.  Prescription for Fioricet 1 tab every 6 hrs PRN migraine given.    Reminded to have prenatal blood work completed.  Pregnancy warning symptoms were reviewed.  RTC in 4 week(s) or PRN.      Tresa EndoKelly I. Remmi Armenteros, NP

## 2015-12-27 LAB — HIV 1&2 ANTIGEN/ANTIBODY: HIV 1&2 ANTIGEN/ANTIBODY: NONREACTIVE

## 2015-12-29 LAB — MATERNAL 2ND TRIMESTER QUAD SCR (14-22 6/7 WEEKS)
AFP MoM: 1.44
AFP: 72 IU/mL
Age at Delivery: 31.7 years
DS A Priori Risk: 1:598 {titer}
DS Screen Risk: <1:38500 {titer}
HCG MoM: 0.75
HCG: 10907 m[IU]/mL
Inhibin A MoM: 1.14
Inhibin A: 195 pg/mL
OSB Risk: 1:4180 {titer}
Patient Weight: 207 [lb_av]
T18 Screen Risk: 1:10400 {titer}
uE3 MoM: 0.71
uE3: 1.56 ng/mL

## 2015-12-29 LAB — VITAMIN D
25-OH VIT D2: 4 ng/mL
25-OH VIT D3: 28 ng/mL
25-OH Vit Total: 32 ng/mL (ref 30–60)

## 2015-12-30 LAB — HEMOGLOBIN ELECTROPHORESIS
HGB F: 0.5 % (ref 0.0–1.9)
Hgb A1: 96.7 % — ABNORMAL LOW (ref 96.8–97.8)
Hgb A2: 2.8 % (ref 2.2–3.2)
Interp,HBE: NORMAL

## 2015-12-30 LAB — LEAD, BLOOD

## 2015-12-30 LAB — HGB ELECT,REVIEW

## 2015-12-30 LAB — LEAD VENOUS: Lead,Venous: <1 ug/dl (ref 0–5)

## 2016-01-01 ENCOUNTER — Telehealth: Payer: Self-pay

## 2016-01-01 NOTE — Telephone Encounter (Signed)
Received fax from pharmacy: Drysol is not covered under [employer], requesting alternative.  Dx  Code [id number]    Lillia DallasLindsey-  Spoke with Apolinar JunesBrandon (pharmacist with [facility] [phone]) requested equivalent medications that are covered by patient's insurance.  There is no alternative medication covered under [employer] for Drysol.  Ricky StabsXerac is an equivalent but, would need a prior authorization.

## 2016-01-01 NOTE — Telephone Encounter (Signed)
Patient can buy Certain Dri at [employer] or perhaps another store to treat increased sweating in armpits since her insurance does not cover these medications.   Based on looking at her medical chart now, she is 19+ weeks pregnant and thus we should avoid any oral medication for sweating/hyperhidrosis at this time,    Herbert SetaHeather please let patient know that OTC Certain Dri which contains the same active ingredients as Drysol is OTC and she can purchase it for under $10 (same instructions for use as what I gave her for Drysol)

## 2016-01-01 NOTE — Telephone Encounter (Signed)
Patient was having other medications refilled and fax for this medication was automatically sent by pharmacy (patient did not request), patient is doing well and does not need treatment at this time.

## 2016-01-05 LAB — CYSTIC FIBROSIS 106-MUTATION PANEL: Result Summary, CFP: NEGATIVE

## 2016-01-09 ENCOUNTER — Other Ambulatory Visit: Payer: Self-pay | Admitting: Obstetrics and Gynecology

## 2016-01-09 DIAGNOSIS — Z3482 Encounter for supervision of other normal pregnancy, second trimester: Secondary | ICD-10-CM

## 2016-02-10 ENCOUNTER — Other Ambulatory Visit: Payer: Self-pay | Admitting: Dermatology

## 2016-02-16 ENCOUNTER — Telehealth: Payer: Self-pay

## 2016-02-16 NOTE — Telephone Encounter (Signed)
Patient called to schedule a follow up on 12/19.    She is interested in getting a new order for her underarm perspiration medication sent to     [id number] - [place], [place] - [address]Patient Preferred][phone]

## 2016-02-16 NOTE — Telephone Encounter (Signed)
Drysol solution was not covered by patient's insurance earlier this year when she called so I would still recommend (as I did earlier this year) that she use OTC Certain Dri or other OTC products containing aluminum chloride to help with sweating until she is seen. If Drysol was not covered earlier this year, it is not likely to be covered now.   Helen AbrahamsMary Morrison please let patient know

## 2016-02-16 NOTE — Telephone Encounter (Signed)
Patient said the smaller quantity is covered she will call the pharmacy and ask how many oz she received last time that was covered

## 2016-02-18 NOTE — Telephone Encounter (Signed)
Left message for patient asking what pharmacy she uses so I can call and find out what size Drysol her insurance will cover.  I gave patient my direct line

## 2016-02-27 ENCOUNTER — Ambulatory Visit: Payer: Self-pay | Admitting: Neurology

## 2016-03-01 ENCOUNTER — Encounter: Payer: Self-pay | Admitting: Neurology

## 2016-03-05 ENCOUNTER — Telehealth: Payer: Self-pay | Admitting: Obstetrics and Gynecology

## 2016-03-05 NOTE — Telephone Encounter (Signed)
Answering Service Contact: Vaginal Spotting     Helen SaranShaunakay Clovis RileyMitchell is a 30 y.o. G3P1011 at 1828w6d who called the answering service to report that she had a small amount of blood noted on tissue after wiping. She says it was "a little more than spotting" and was light pink in color, but did not soak through the tissue, and she did not notice any bleeding in the toilet. She wiped again immediately and did not note any blood. She has not had bleeding since then but is worried and is wondering if she should be evaluated. She denies recent intercourse, dysuria, hematuria. She denies any change in vaginal discharge. Denies contractions, LOF, any more VB. Baby moving well.     After discussing her symptoms, patient and provider agree that she should continue to monitor her symptoms and call back or present for evaluation if she notices continued bleeding or increased bleeding, regular contractions develop or loss of fluid, or decreased fetal movements. She was encouraged to call our service if her symptoms changed or she had further questions.     Cleophus MoltKatrina Micha Erck, MD, PhD  OB/GYN PGY-2  [phone]  03/05/2016   9:04 PM

## 2016-03-12 ENCOUNTER — Encounter: Payer: Self-pay | Admitting: Obstetrics and Gynecology

## 2016-03-12 ENCOUNTER — Ambulatory Visit: Payer: Self-pay | Admitting: Obstetrics and Gynecology

## 2016-03-12 VITALS — BP 124/75 | HR 89 | Ht 67.01 in | Wt 223.0 lb

## 2016-03-12 DIAGNOSIS — Z3481 Encounter for supervision of other normal pregnancy, first trimester: Secondary | ICD-10-CM

## 2016-03-12 DIAGNOSIS — O99891 Other specified diseases and conditions complicating pregnancy: Secondary | ICD-10-CM

## 2016-03-12 DIAGNOSIS — Z3A30 30 weeks gestation of pregnancy: Secondary | ICD-10-CM

## 2016-03-12 DIAGNOSIS — R87612 Low grade squamous intraepithelial lesion on cytologic smear of cervix (LGSIL): Secondary | ICD-10-CM

## 2016-03-12 DIAGNOSIS — Z8669 Personal history of other diseases of the nervous system and sense organs: Secondary | ICD-10-CM

## 2016-03-12 DIAGNOSIS — M549 Dorsalgia, unspecified: Secondary | ICD-10-CM

## 2016-03-12 LAB — POCT URINALYSIS DIPSTICK
Blood,UA POCT: NEGATIVE
Glucose,UA POCT: NORMAL
Ketones,UA POCT: NEGATIVE
Leuk Esterase,UA POCT: NEGATIVE
Lot #: 21026702
Nitrite,UA POCT: NEGATIVE
PH,UA POCT: 7 (ref 5–8)
Protein,UA POCT: NEGATIVE mg/dL

## 2016-03-12 NOTE — Progress Notes (Signed)
[facility]   Routine OB Check    Helen Morrison reports doing fine.  She hasn't been seen in the office since June - she wasn't sure after her last appointment when she was due to come back and then she lost track of time and made the next available appointment.  She denies contractions, LOF, or VB and reports feeling regular fetal movement.  She is experiencing worsening of her chronic sciatic nerve pain - she had been seeing PT previously but thinks she may need to go back.  Denies weakness, difficulty walking or numbness but does get shooting pains down her legs from her back.    PHYSICAL EXAM:  Vitals:    03/12/16 1428 03/12/16 1601   BP: (!) 126/95 124/75   Pulse: 89    Weight: 101.2 kg (223 lb)    Height: 1.702 m (5' 7.01")        Gen: Well-appearing pregnant female in NAD  Abd: Gravid, no fundal tenderness    Doptones: 150 bpm  Fundal Height: 31 cm  TWG: 26#    ASSESSMENT/PLAN:  30 y.o. G3P1011 at 5244w6d presents for routine OBC.    Problem   Supervision of Normal Pregnancy in First Trimester    G3P1011 with EDD 05/15/16 by LMP c/w 6 week USN.    Pregnancy Risks:  H/o VAVD - 2829 grams for maternal exhaustion; c/b 3rd degree obstetric laceration  Obesity (Pre-preg BMI 30)  LSIL pap - needs colposcopy postpartum  Back pain in pregnancy - exacerbation of sciatica requesting referral to PT    1. Routine Prenatal Care  - UCx neg, UTox neg  - Rh positive, Rubella immune  - CF neg / HgbElec WNL/ lead <1  - HIV/syphilis/HepBsAg neg  - GC/CT/trich neg  - anatomic USN completed: anterior placenta, CL 2.7cm.  No defects detected.  - genetic screening: STS neg  - declines flu vaccination during pregnancy, received TDAP 03/12/16    <> Glucola, CBC (24-28 weeks) - encouraged to complete 03/12/16 (5044w6d)  <> GBS (36 weeks)    Expecting a BOY, requesting circumcision  Planning to breastfeed     LSIL pap    LSIL pap    <> needs PP colposcopy     History of Migraine Headaches    [place]  [place].    Endorses some tension headaches this pregnancy, but no migraines.    Missed neurology appt       [facility]     Webb SilversmithKristen Annalis Kaczmarczyk MD  OB/GYN Attending Physician  Pager # [phone]  03/12/2016  2:42 PM

## 2016-03-14 ENCOUNTER — Encounter: Payer: Self-pay | Admitting: Obstetrics and Gynecology

## 2016-03-26 ENCOUNTER — Other Ambulatory Visit
Admission: RE | Admit: 2016-03-26 | Discharge: 2016-03-26 | Disposition: A | Discharge status: Home / Self Care | Payer: Self-pay | Source: Ambulatory Visit | Attending: Obstetrics and Gynecology | Admitting: Obstetrics and Gynecology

## 2016-03-26 DIAGNOSIS — Z3A30 30 weeks gestation of pregnancy: Secondary | ICD-10-CM

## 2016-03-26 LAB — CBC
Hematocrit: 33 % — ABNORMAL LOW (ref 34–45)
Hemoglobin: 10.8 g/dL — ABNORMAL LOW (ref 11.2–15.7)
MCH: 29 pg/cell (ref 26–32)
MCHC: 33 g/dL (ref 32–36)
MCV: 88 fL (ref 79–95)
Platelets: 216 10*3/uL (ref 160–370)
RBC: 3.8 MIL/uL — ABNORMAL LOW (ref 3.9–5.2)
RDW: 13.4 % (ref 11.7–14.4)
WBC: 9.6 10*3/uL (ref 4.0–10.0)

## 2016-03-26 LAB — GLUCOSE TOLERANCE, 1 HOUR: Glucose,50gm 1HR: 98 mg/dL (ref 63–135)

## 2016-03-31 ENCOUNTER — Ambulatory Visit: Payer: Self-pay | Admitting: Reproductive Endocrinology and Infertility

## 2016-03-31 ENCOUNTER — Encounter: Payer: Self-pay | Admitting: Reproductive Endocrinology and Infertility

## 2016-03-31 VITALS — BP 137/84 | HR 95 | Ht 67.0 in | Wt 225.0 lb

## 2016-03-31 DIAGNOSIS — Z3A33 33 weeks gestation of pregnancy: Secondary | ICD-10-CM

## 2016-03-31 DIAGNOSIS — Z3481 Encounter for supervision of other normal pregnancy, first trimester: Secondary | ICD-10-CM

## 2016-03-31 NOTE — Progress Notes (Signed)
Patient at 9621w4d given "20 Weeks and BeyondAdministrator, arts" educational materials.     Reviewed contents with patient and she states understanding.     Jacklyn ShellAnnmarie L Nadav Swindell, RN

## 2016-03-31 NOTE — Progress Notes (Signed)
Helen Morrison is a [age] y.o. G3P1011 who presents for routine Prenatal Visit at 7031w4d     Subjective:Helen Morrison is doing well.  She denies vaginal bleeding, discharge or LOF. She reports good fetal movement. She offers no complaints or concerns at this time.    OB History     Gravida Para Term Preterm AB Living    3 1 1  0 1 1    SAB TAB Ectopic Multiple Live Births    1 0 0 0 1          Pregnancy ROS:    LOF: None  VB: None  Fetal movement: Yes    Objective:    Vitals:    03/31/16 1408   BP: 137/84   Pulse: 95   Weight: 102.1 kg (225 lb)   Height: 1.702 m (5\' 7" )     Fundal Height: 34 cm  FHT: 148 bpm     Assessment:   30 y.o. G3P1011 at 1431w4d weeks gestation    Plan:    Problem   Supervision of Normal Pregnancy in First Trimester    [id number] with EDD 05/15/16 by LMP c/w 6 week USN.    Pregnancy Risks:  H/o VAVD - 2829 grams for maternal exhaustion; c/b 3rd degree obstetric laceration  Obesity (Pre-preg BMI 30)  LSIL pap - needs colposcopy postpartum  Back pain in pregnancy - exacerbation of sciatica requesting referral to PT    1. Routine Prenatal Care  - UCx neg, UTox neg  - Rh positive, Rubella immune  - CF neg / HgbElec WNL/ lead <1  - HIV/syphilis/HepBsAg neg  - GC/CT/trich neg  - anatomic USN completed: anterior placenta, CL 2.7cm.  No defects detected.  - genetic screening: STS neg  - declines flu vaccination during pregnancy, received TDAP 03/12/16  - Glucola 98  <> GBS (36 weeks)    Expecting a BOY, requesting circumcision  Planning to breastfeed  Peds: <>       Reviewed normal GTT results.    Reviewed symptoms of PTL    Pregnancy warning symptoms were reviewed.  RTC in 2 week(s) or PRN.      Tresa EndoKelly I. Caydance Kuehnle, NP

## 2016-04-09 ENCOUNTER — Encounter: Payer: Self-pay | Admitting: Rehabilitative and Restorative Service Providers"

## 2016-04-09 ENCOUNTER — Ambulatory Visit: Payer: Self-pay | Admitting: Rehabilitative and Restorative Service Providers"

## 2016-04-09 DIAGNOSIS — O99891 Other specified diseases and conditions complicating pregnancy: Secondary | ICD-10-CM

## 2016-04-09 DIAGNOSIS — M549 Dorsalgia, unspecified: Secondary | ICD-10-CM

## 2016-04-09 NOTE — Progress Notes (Signed)
Physical Therapy Daily Flowsheet:  *Please see Physical Therapy Exercise Flowsheet for details regarding exercises completed this session.*     04/09/16 0800   Overview   Diagnosis back pain in pregnancy   Insurance Medicaid   Script Date (03/12/16)   Visit # 1   Precautions Pregnancy: 34 weeks    Additional Comments IE see note   Functional Outcome Measures   Low Back Pain and Disability Index (Revised Oswestry) Yes   Pain Intensity 4   Personal Care 2   Lifting 0   Walking 1   Sitting 2   Standing 1   Sleeping 2   Social Life 1   Driving/Riding in car, etc 1   Changing degree of pain 3   Low back pain and disability index score 34   Patient Education   Educated in disease process Yes   Educated in home exercise program Yes   Educated in proper body mechanics Yes   Educated in proper postural mechanics and management Yes   Time Calculation   PT Timed Codes 0   PT Untimed Codes 30   PT Unbilled Time 0   PT Total Treatment 30   Plan and Onset date   Plan of Care Date 04/09/16   Onset Date 03/12/16   Treatment Start Date 04/09/16   Charges   CC Charges PT Eval Low complexity - code 7161     Raynelle FanningJulie Buerger, PT, DPT

## 2016-04-09 NOTE — Progress Notes (Signed)
Department of Physical Medicine & Rehabilitation  Physical Therapy Initial Assessment    HISTORY:  Diagnosis: back pain in pregnancy    Past Medical History:   Diagnosis Date    Abnormal Pap smear     last negative 04/2012    Anemia     Migraines     Scoliosis     Vitamin D deficiency      Past Surgical History:   Procedure Laterality Date    no fam hx of anes complic      as of 10/16/2012    no hx of anes complic      as of 10/16/2012       Referring practitioner: Webb Silversmith, MD  Onset date on symptoms/Date of Surgery:  03/12/16  Mechanism of injury: chronic sciatic nerve pain, worsened since pregnancy   Diagnostic tests: none  Previous Treatments: PT for knee  Work Status: Computer work mainly, or site visit   Sport(s)/Activities:     SUBJECTIVE:   Symptom location: lower back, pain down back of legs sometimes.   Relevant symptoms: shooting pain down her legs from her back  Symptom frequency: intermittent   Symptom intensity (0 - 10 scale): Now 2 Best 0 Worst 9   Symptoms worsen with/Current functional limitations: worse in the evening, before bed.   Symptoms improve with:   Red flags:  Patient denies fever, recent nausea/vomiting, recent sudden weight loss, loss of bowel/bladder, saddle parathesias.      Patients goals for therapy: Reduce pain        OBJECTIVE:  Observation: Patient is a pleasant and cooperative female in NAD.     Palpation:  Generalized lumbar soreness  Sensation: No deficits noted        R UE: AROM within normal limits   L UE: AROM within normal limits   R LE: AROM within functional limits   L LE: AROM within functional limits    *Indicates pain    Strength:      LE Muscle group Right  Left    Hip flexors  5  5   Hip extensors     Hip Abductors     Hip Adductors     Knee extensors  5 5   Knee flexors     Ankle dorsiflexors 5 5   Ankle plantar flexors      Ankle Inverters      Ankle Evertors     Extensor hallucis longus          Special tests:  Lumbar:  Straight Leg Raise,  Right LE   positive, Left LE   positive  Sacroilliac Joint:  FABER / Patricks Test:  bilateral  negative                       Flexibility:   Hamstring:  Moderate tightness  Quads:  Moderate tightness  Psoas:  Moderate tightness    Endurance: good   Exercises: See flowsheet for details.  Patient Education:  Patient Education  Educated in disease process: Yes  Educated in home exercise program: Yes  Educated in proper body mechanics: Yes  Educated in proper postural mechanics and management: Yes,    Functional outcome measures: Modified Oswestry: 34%    ASSESSMENT:  Helen Morrison is a 30 y.o. female who presents to physical therapy with pain, decreased ROM, weakness, postural and gait deviations secondary to signs and symptoms consistent with Diagnosis: back pain in pregnancy. Skilled physical therapy services indicated  to increase function and to address goals below.    The following comorbidities may affect treatment/recovery: Pregnancy and the following Personal factors may affect treatment/recovery: none identified.      Clinical presentation: evolving    Patient complexity is low level as indicated by above personal factors, environmental factors and comorbidities in addition to their impairments found on physical exam.       Rehab potential/prognosis: good  Patient's understanding: good     Short term goals: 2 weeks  Pain:  Patient will be able to verbalize use of ice or heat for pain management.  Initiate HEP with patient  Patient demonstrate proper body mechanics from the floor.    Long term goals: 4 weeks  Pain:  Patient will be able to centralize pain  Patient independent and compliant with final HEP  Modified Oswestry </= 24%    PLAN:  Plan of care: Appropriate for PT  PT interventions: AROM/PROM/Therapeutic exercise, Flexibility, General conditioning, Heat, Home exercise program instruction, Manual therapy, Myofacial release, Patient/Family Education, Postural training/body Curatormechanic education, Strengthening  PT  frequency:  Once a week  PT duration: 4 weeks                                                 *Based on clinical judgement, objective measures and subjective reports      Thank you for the referral.  If you have any questions and/or concerns, please feel free to contact me at [phone].      Trish MageJulie Buerger, PT, DPT

## 2016-04-15 NOTE — Progress Notes (Signed)
Physical Therapy Exercise Flowsheet:  *Please refer to Physical Therapy Daily Flowsheet for further details of this session.*     04/15/16 1000   Lumbar/Abdominal Exercises   Bridges Comment HEP   Cat/Camel Comment HEP   additional exercise Instructed in childs pose for HEP, pt wearing a dress to appt so difficult to perform today   Hip Exercises   Hamstring Stretch, Stair Comment HEP in sitting   Piriformis Stretch, Supine Comment HEP in sitting     Raynelle FanningJulie Buerger, PT, DPT

## 2016-04-16 ENCOUNTER — Ambulatory Visit: Payer: Self-pay | Admitting: Reproductive Endocrinology and Infertility

## 2016-04-16 VITALS — BP 129/74 | HR 87 | Ht 67.0 in | Wt 228.0 lb

## 2016-04-16 DIAGNOSIS — Z3A36 36 weeks gestation of pregnancy: Secondary | ICD-10-CM

## 2016-04-16 DIAGNOSIS — Z3480 Encounter for supervision of other normal pregnancy, unspecified trimester: Secondary | ICD-10-CM

## 2016-04-16 DIAGNOSIS — Z3481 Encounter for supervision of other normal pregnancy, first trimester: Secondary | ICD-10-CM

## 2016-04-16 NOTE — Addendum Note (Signed)
Addended by: Loma NewtonGILBERT, Viktoriya Glaspy on: 04/16/2016 04:46 PM     Modules accepted: Orders

## 2016-04-16 NOTE — Progress Notes (Signed)
[name] Clovis RileyMitchell is a 30 y.o. G3P1011 who presents for routine Prenatal Visit at 1637w6d     Subjective:Nohemi is doing well.  She denies vaginal bleeding, discharge or LOF. She reports good fetal movement. She offers no complaints or concerns at this time.  Rep[place] [facility] [place].  Plans to be there for the weekend and will be a passenger in the car.    OB History     Gravida Para Term Preterm AB Living    3 1 1  0 1 1    SAB TAB Ectopic Multiple Live Births    1 0 0 0 1          Pregnancy ROS:   LOF: None  VB: None  Fetal movement: Yes    Objective:    Vitals:    04/16/16 1546   BP: 129/74   Pulse: 87   Weight: 103.4 kg (228 lb)   Height: 1.702 m (5\' 7" )     Fundal Height: 38 cm  FHT: 154 bpm     Assessment:   30 y.o. G3P1011 at 7237w6d weeks gestation    Plan:    Problem   Supervision of Normal Pregnancy in First Trimester    [id number] with EDD 05/15/16 by LMP c/w 6 week USN.    Pregnancy Risks:  H/o VAVD - 2829 grams for maternal exhaustion; c/b 3rd degree obstetric laceration  Obesity (Pre-preg BMI 30)  LSIL pap - needs colposcopy postpartum  Back pain in pregnancy - exacerbation of sciatica requesting referral to PT    1. Routine Prenatal Care  - UCx neg, UTox neg  - Rh positive, Rubella immune  - CF neg / HgbElec WNL/ lead <1  - HIV/syphilis/HepBsAg neg  - GC/CT/trich neg  - anatomic USN completed: anterior placenta, CL 2.7cm.  No defects detected.  - genetic screening: STS neg  - declines flu vaccination during pregnancy, received TDAP 03/12/16  - Glucola 98  <> GBS (36 weeks)- collected 10/13    Expecting a BOY, requesting circumcision  Planning to breastfeed  Peds: <>       GBS collected and third trimester labs ordered  Reviewed symptoms of labor, location of OB triage, when to call and number to call for symptoms of labor.     ROI signed for [facility].  Late pregnancy travel precautions discussed.  Pregnancy warning symptoms were  reviewed.  RTC in 1 week(s) or PRN.      Tresa EndoKelly I. Preston Garabedian, NP

## 2016-04-18 LAB — GROUP B STREP CULTURE: Group B Strep Culture: DETECTED — AB

## 2016-04-19 ENCOUNTER — Encounter: Payer: Self-pay | Admitting: Reproductive Endocrinology and Infertility

## 2016-04-19 DIAGNOSIS — B951 Streptococcus, group B, as the cause of diseases classified elsewhere: Secondary | ICD-10-CM | POA: Insufficient documentation

## 2016-04-19 LAB — CHLAMYDIA PLASMID DNA AMPLIFICATION: Chlamydia Plasmid DNA Amplification: 0

## 2016-04-19 LAB — N. GONORRHOEAE DNA AMPLIFICATION: N. gonorrhoeae DNA Amplification: 0

## 2016-04-21 ENCOUNTER — Ambulatory Visit: Payer: Self-pay | Admitting: Reproductive Endocrinology and Infertility

## 2016-04-21 ENCOUNTER — Encounter: Payer: Self-pay | Admitting: Reproductive Endocrinology and Infertility

## 2016-04-21 VITALS — BP 129/93 | HR 87 | Ht 67.01 in | Wt 229.6 lb

## 2016-04-21 DIAGNOSIS — Z3481 Encounter for supervision of other normal pregnancy, first trimester: Secondary | ICD-10-CM

## 2016-04-21 DIAGNOSIS — Z3A36 36 weeks gestation of pregnancy: Secondary | ICD-10-CM

## 2016-04-21 DIAGNOSIS — B951 Streptococcus, group B, as the cause of diseases classified elsewhere: Secondary | ICD-10-CM

## 2016-04-21 LAB — POCT URINALYSIS DIPSTICK
Glucose,UA POCT: NORMAL
Ketones,UA POCT: NEGATIVE
Leuk Esterase,UA POCT: 2 — AB
Lot #: 21026702
Nitrite,UA POCT: NEGATIVE
PH,UA POCT: 7 (ref 5–8)

## 2016-04-21 NOTE — Progress Notes (Signed)
[name] Clovis RileyMitchell is a 30 y.o. G3P1011 who presents for routine Prenatal Visit at 1220w4d     Subjective:Charlet is doing well.  She denies vaginal bleeding, discharge or LOF. She reports good fetal movement. She offers no complaints or concerns at this time.  [place] [place].     OB History     Gravida Para Term Preterm AB Living    3 1 1  0 1 1    SAB TAB Ectopic Multiple Live Births    1 0 0 0 1          Pregnancy ROS:   LOF: None  VB: None  Fetal movement: Yes    Objective:    Vitals:    04/21/16 1357   BP: (!) 129/93   Pulse: 87   Weight: 104.1 kg (229 lb 9.6 oz)   Height: 1.702 m (5' 7.01")     Fundal Height: 36 cm  FHT: 149 bpm     Assessment:   30 y.o. G3P1011 at 6020w4d weeks gestation; GBS Positive    Plan:    Problem   Supervision of Normal Pregnancy in First Trimester    [id number] with EDD 05/15/16 by LMP c/w 6 week USN.    Pregnancy Risks:  H/o VAVD - 2829 grams for maternal exhaustion; c/b 3rd degree obstetric laceration  Obesity (Pre-preg BMI 30)  LSIL pap - needs colposcopy postpartum  Back pain in pregnancy - exacerbation of sciatica requesting referral to PT    1. Routine Prenatal Care  - UCx neg, UTox neg  - Rh positive, Rubella immune  - CF neg / HgbElec WNL/ lead <1  - HIV/syphilis/HepBsAg neg  - GC/CT/trich neg  - anatomic USN completed: anterior placenta, CL 2.7cm.  No defects detected.  - genetic screening: STS neg  - declines flu vaccination during pregnancy, received TDAP 03/12/16  - Glucola 98  - GBS (36 weeks)- positive    Expecting a BOY, requesting circumcision  Planning to breastfeed  BC: Immediate Liletta IUD  Peds: <>       Reviewed postive GBS status and need for antibiotics in labor.  PCN allergy.    Discussed pp contraception.  Desires immediate Liletta IUD, consent obtained today.    Discussed safe travel in term pregnancy.  Advised to get out of the car and move around to help promote circulation.    Pregnancy warning symptoms were  reviewed.  RTC in 1 week(s) or PRN.      Tresa EndoKelly I. Yaa Donnellan, NP

## 2016-04-21 NOTE — Procedures (Deleted)
Procedures

## 2016-04-23 ENCOUNTER — Encounter: Payer: Self-pay | Admitting: Reproductive Endocrinology and Infertility

## 2016-04-30 ENCOUNTER — Ambulatory Visit: Payer: Self-pay | Admitting: Obstetrics and Gynecology

## 2016-04-30 VITALS — BP 132/77 | HR 83 | Ht 67.01 in | Wt 231.0 lb

## 2016-04-30 DIAGNOSIS — Z3A37 37 weeks gestation of pregnancy: Secondary | ICD-10-CM

## 2016-04-30 DIAGNOSIS — Z3481 Encounter for supervision of other normal pregnancy, first trimester: Secondary | ICD-10-CM

## 2016-04-30 NOTE — Student Note (Signed)
Routine OB Visit Note      Subjective    CC: Routine OB visit    HPI: [name] Clovis RileyMitchell is a [age] year old G3P1011 at 545w5d by first trimester ultrasound, with a history of vacuum-assisted vaginal delivery for maternal exhaustion and 3rd degree obstetric laceration, presenting today for a routine prenatal visit. She doesn't have symptoms she is concerned about today, but does want to discuss her care during delivery. She would like to avoid another vacuum-assisted delivery, and wants to know what we can do to try to prevent this. She would accept this procedure if it were necessary for fetal wellbeing, however, if we suspect that she would require surgical assistance this time, she would prefer a cesarean section to vacuum-assist.    She also wants to make sure we are clear that she has an anaphylactic reaction to Penicillin, since she will be receiving antibiotics for group-B strep. She does not remember ever receiving Cephalosporin antibiotics.    She denies any bleeding or leakage of fluid. She has had some intermittent contractions. One day in the past week she had contractions that were five minutes apart, but they resolved after one hour. She endorses good fetal movement.      Objective    Vitals:  BP 132/77   Pulse 83   Ht 1.702 m (5' 7.01")   Wt 104.8 kg (231 lb)   LMP 08/09/2015 (Exact Date)   BMI 36.17 kg/m2  Total Weight Gain:  15.8 kg (34 lb 12.7 oz)    General: This is a pleasant, gravid, well-appearing, female in no acute distress.    Fundal height: 38cm  Fetal presentation: Cephalic  Fetal heart rate: 148 bmp      Assessment/Plan    Herbert MoorsShaunakay Kolinski is a 30 y/o 873P1011 female at 5435w6d. Although she would prefer a spontaneous vaginal delivery, she would opt for a cesarean section if we thought that a surgically-assisted vaginal delivery might be likely. This is a significant risk based on her prior history of vacuum-assisted vaginal delivery and third degree obstetric laceration. To assess for this  risk with this pregnancy, she will go for a repeat ultrasound to estimate fetal weight. We will discuss the ultrasound results with her next week, and make a plan for spontaneous vaginal delivery versus cesarean section. She was counseled on the risks and benefits of all delivery options.    She is group-B strep positive, but has a history of anaphylaxis in response to Penicillin. Since she is not known to have been exposed to Cephalosporins in the past, she will receive intrapartum Vancomycin for prophylaxis.    Follow up in 1 week.      Author: Albertina SenegalMatthew Ashraf Mesta, CC3

## 2016-05-05 ENCOUNTER — Ambulatory Visit: Payer: Self-pay

## 2016-05-05 DIAGNOSIS — Z3A37 37 weeks gestation of pregnancy: Secondary | ICD-10-CM

## 2016-05-07 ENCOUNTER — Ambulatory Visit
Admission: RE | Admit: 2016-05-07 | Discharge: 2016-05-07 | Disposition: A | Discharge status: Home / Self Care | Payer: Self-pay | Source: Ambulatory Visit | Admitting: Obstetrics and Gynecology

## 2016-05-07 ENCOUNTER — Encounter: Payer: Self-pay | Admitting: Obstetrics and Gynecology

## 2016-05-07 ENCOUNTER — Encounter: Payer: Self-pay | Admitting: Anesthesiology

## 2016-05-07 ENCOUNTER — Inpatient Hospital Stay
Admission: AD | Admit: 2016-05-07 | Disposition: A | Discharge status: Home / Self Care | Payer: Self-pay | Source: Ambulatory Visit | Attending: Obstetrics and Gynecology | Admitting: Obstetrics and Gynecology

## 2016-05-07 DIAGNOSIS — Z3490 Encounter for supervision of normal pregnancy, unspecified, unspecified trimester: Secondary | ICD-10-CM

## 2016-05-07 LAB — COMPREHENSIVE METABOLIC PANEL
ALT: 19 U/L (ref 0–35)
AST: 20 U/L (ref 0–35)
Albumin: 3.7 g/dL (ref 3.5–5.2)
Alk Phos: 147 U/L — ABNORMAL HIGH (ref 35–105)
Anion Gap: 12 (ref 7–16)
Bilirubin,Total: 0.4 mg/dL (ref 0.0–1.2)
CO2: 23 mmol/L (ref 20–28)
Calcium: 9.4 mg/dL (ref 8.8–10.2)
Chloride: 104 mmol/L (ref 96–108)
Creatinine: 0.57 mg/dL (ref 0.51–0.95)
GFR,Black: 142 *
GFR,Caucasian: 123 *
Glucose: 74 mg/dL (ref 60–99)
Lab: 6 mg/dL (ref 6–20)
Potassium: 4.3 mmol/L (ref 3.3–5.1)
Sodium: 139 mmol/L (ref 133–145)
Total Protein: 7.1 g/dL (ref 6.3–7.7)

## 2016-05-07 LAB — LACTATE DEHYDROGENASE: LD: 184 U/L (ref 118–225)

## 2016-05-07 LAB — CBC
Hematocrit: 35 % (ref 34–45)
Hemoglobin: 11.6 g/dL (ref 11.2–15.7)
MCH: 29 pg/cell (ref 26–32)
MCHC: 33 g/dL (ref 32–36)
MCV: 87 fL (ref 79–95)
Platelets: 217 10*3/uL (ref 160–370)
RBC: 4.1 MIL/uL (ref 3.9–5.2)
RDW: 13.9 % (ref 11.7–14.4)
WBC: 9.7 10*3/uL (ref 4.0–10.0)

## 2016-05-07 LAB — URIC ACID: Urate: 4 mg/dL (ref 2.7–6.8)

## 2016-05-07 LAB — TYPE AND SCREEN
ABO RH Blood Type: O POS
Antibody Screen: NEGATIVE

## 2016-05-07 LAB — HOLD EXTRA URINE

## 2016-05-07 LAB — HIV-1/2 RAPID SCREEN AB - MEDICALLY URGENT: Rapid HIV 1&2: NEGATIVE

## 2016-05-07 LAB — RUPTURE OF MEMBRANES: Rupture of Membranes: NEGATIVE

## 2016-05-07 MED ORDER — LIDOCAINE-EPINEPHRINE (PF) 1.5 %-1:200000 IJ SOLN *I*
INTRAMUSCULAR | Status: DC | PRN
Start: 2016-05-07 — End: 2016-05-08
  Administered 2016-05-07: 3 mL via EPIDURAL

## 2016-05-07 MED ORDER — BUPIVACAINE HCL 0.125% IN NACL *I*
INTRAMUSCULAR | Status: DC | PRN
Start: 2016-05-07 — End: 2016-05-08
  Administered 2016-05-07 (×2): 5 mL via EPIDURAL

## 2016-05-07 MED ORDER — FENTANYL CITRATE 50 MCG/ML IJ SOLN *WRAPPED*
INTRAMUSCULAR | Status: AC
Start: 2016-05-07 — End: 2016-05-07
  Filled 2016-05-07: qty 2

## 2016-05-07 MED ORDER — LEVONORGESTREL 18.6 MCG/DAY IU IUD
1.0000 | INTRAUTERINE_SYSTEM | INTRAUTERINE | Status: AC
Start: 2016-05-07 — End: 2016-05-08
  Administered 2016-05-08: 1 via INTRAUTERINE
  Filled 2016-05-07: qty 1

## 2016-05-07 MED ORDER — FENTANYL 2 MCG/ML AND 0.0625% BUPIVACAINE IN 250 ML NS *I*
14.0000 mL/h | INTRAMUSCULAR | Status: DC
Start: 2016-05-07 — End: 2016-05-08
  Administered 2016-05-07: 14 mL/h via EPIDURAL

## 2016-05-07 MED ORDER — FENTANYL 2 MCG/ML AND 0.0625% BUPIVACAINE IN 250 ML NS *I*
INTRAMUSCULAR | Status: AC
Start: 2016-05-07 — End: 2016-05-07
  Filled 2016-05-07: qty 250

## 2016-05-07 MED ORDER — LACTATED RINGERS IV SOLN *I*
150.0000 mL/h | INTRAVENOUS | Status: DC
Start: 2016-05-07 — End: 2016-05-08
  Administered 2016-05-07: 150 mL/h via INTRAVENOUS

## 2016-05-07 MED ORDER — OXYTOCIN 30 UNITS IN 500ML NS WRAPPED *I*
1.0000 m[IU]/min | INTRAMUSCULAR | Status: DC
Start: 2016-05-07 — End: 2016-05-08
  Administered 2016-05-07: 6 m[IU]/min via INTRAVENOUS
  Administered 2016-05-07: 8 m[IU]/min via INTRAVENOUS
  Administered 2016-05-07: 4 m[IU]/min via INTRAVENOUS
  Administered 2016-05-07: 2 m[IU]/min via INTRAVENOUS
  Administered 2016-05-08: 10 m[IU]/min via INTRAVENOUS
  Administered 2016-05-08: 12 m[IU]/min via INTRAVENOUS
  Filled 2016-05-07: qty 500

## 2016-05-07 MED ORDER — VANCOMYCIN HCL IN DEXTROSE 5 MG/ML IV SOLN *I*
1000.0000 mg | Freq: Two times a day (BID) | INTRAVENOUS | Status: DC
Start: 2016-05-07 — End: 2016-05-08
  Administered 2016-05-07: 1000 mg via INTRAVENOUS
  Filled 2016-05-07 (×2): qty 200

## 2016-05-07 MED ORDER — LACTATED RINGERS IV BOLUS *I*
1000.0000 mL | INTRAVENOUS | Status: DC | PRN
Start: 2016-05-07 — End: 2016-05-08
  Administered 2016-05-07: 1000 mL via INTRAVENOUS

## 2016-05-07 MED ORDER — FENTANYL CITRATE 50 MCG/ML IJ SOLN *WRAPPED*
INTRAMUSCULAR | Status: DC | PRN
Start: 2016-05-07 — End: 2016-05-08
  Administered 2016-05-07: 21:00:00 100 ug via INTRAMUSCULAR

## 2016-05-07 MED ORDER — LIDOCAINE HCL 1 % IJ SOLN *I*
INTRAMUSCULAR | Status: DC | PRN
Start: 2016-05-07 — End: 2016-05-08
  Administered 2016-05-07: 3 mL via SUBCUTANEOUS

## 2016-05-07 NOTE — Anesthesia Procedure Notes (Addendum)
---------------------------------------------------------------------------------------------------------------------------------------    NEURAXIAL BLOCK PLACEMENT  Labor Epidural      Patient Location:  L&D    Reason for Block: labor epidural    CONSENT AND TIMEOUT     Consent:  Obtained per policy    Timeout: patient identified (name/DOB) , nerve block procedure/site/side verified by patient or family, proper patient position verified, operative procedure/site/side verified by patient or family , needed equipment, monitors, medications and access verified as present and functioning , allergies reviewed with patient/record , all members of the block team participated in the timeout and anticoagulation/antiplatelet status reviewed  METHOD:    Patient Position: sitting    Monitoring: blood pressure    Sedation Used: no            For medications used, please see MAR    Level of Sedation: none      Prep: aseptic technique per protocol, povidone-iodine and patient draped      Successful Approach: midline    Successful Location: L3-4    Technique: LOR saline    Attempts (Skin Punctures):  1     Lot Number: [id number]; Expiration Date: 2017-07-04  NEEDLE AND CATHETER:  Needle:    Needle Type: Tuohy    Needle Gauge:  18 G    Needle Length:  9 cm    Needle Insertion Depth:  7  Catheter:     Catheter Size: 20 gauge    Catheter Type: closed tip (multi-orifice)  Tunneled: No      Catheter in Space:  5    Catheter at Skin: 12  TESTING AND VALIDATION:    Catheter Aspirate: negative      Test Dose Response: negative  OBSERVATIONS:    Block Completion:  Successfully completed    Sensory Levels: more right sided    Epidural sensory level: T6 R, T7 L.  Wet Tap: No      Paresthesia: none  Neuraxial Blood: blood not aspirated      Motor Block: mild    Patient Reaction to Block: tolerated procedure well, pain relief, vitals remained stable and fetal stability  STAFF     Performed by: credentialed extender under general supervision     Attending Attestation: The procedure was performed under general supervision   Extender: Saintclair HalstedFERNANDEZORTEGA, CARLOS  ----------------------------------------------------------------------------------------------------------------------------------------

## 2016-05-07 NOTE — Anesthesia Preprocedure Evaluation (Addendum)
Anesthesia Pre-operative History and Physical for Helen Morrison    ______________________________________________________________________________________    Summary:  Helen Morrison is a 30 y.o. Female (G, presents with a 6841w6d gestation, requesting a labor epidural for pain control.    Significant past medical history:    1. Migraines  2. Scoliosis  3. Obesity    WT: 104.8 kg, 36.17 BMI  Allergies: PCN (swelling)  Labs: Plt 217, Hgb 11.6, type and screen current Time; today/tomorrow: today.    Blood pressure 129/72, pulse 99, temperature 36.9 C (98.4 F), temperature source Temporal, resp. rate 18, height 1.702 m (5' 7.01"), weight 104.8 kg (231 lb), last menstrual period 08/09/2015, SpO2 97 %.  By Saintclair Halstedarlos Fernandezortega, MD at 7:46 PM on 05/07/2016    <[email or web address]>  Anesthesia Evaluation Information Source: patient, records     ANESTHESIA  Pertinent(-):  history of anesthetic complications, Family Hx of Anesthetic Complications    GENERAL    + Obesity  Pertinent (-):  history of anesthetic complications, Family Hx of Anesthetic Complications    HEENT  Pertinent (-):   TMJD, anatomic issues, neck pain PULMONARY  Pertinent(-): asthma, COPD    CARDIOVASCULAR  Pertinent(-):  hypertension, angina, valvular heart disease, anticoagulants, CHF, DVT    GI/HEPATIC/RENAL  Last PO Intake: Enter Last PO Intake in ROS/Med Hx Tab    + GERD  Pertinent(-):  liver  issues, renal issues NEURO/PSYCH    + Headaches            migraines    + Positioning issues (scoliosis)  Pertinent(-):  seizures, neuro deficit, increased ICP, spinal cord injury/defect    ENDO/OTHER  Pertinent(-):  diabetes mellitus, thyroid disease    HEMALOGIC  Pertinent(-):  coagulopathy, anticoagulants       Physical Exam    Airway            Mouth opening: normal            Mallampati: III            TM distance (fb): >3 FB            Neck ROM: full            Airway Impression: easy  Dental   Normal Exam   Cardiovascular           Rhythm: regular            Rate: normal  No murmur    Neurologic    Normal Exam     Pulmonary     breath sounds clear to auscultation    No cough, rhonchi, stridor    Mental Status   Normal Exam    oriented to person, place and time       ________________________________________________________________________  Plan  ASA Score  2  Anesthetic Plan epidural    ; Line ( use current access); Positioning (sitting); Pain (caudal/epidural)    Informed Consent     Risks:          Risks discussed were commensurate with the plan listed above with the following specific points: N/V, sore throat, hypotension, headache, failed block and infection , damage to:(nerves, blood vessels), allergic Rx, unexpected serious injury    Anesthetic Consent:      Anesthetic plan (and risks as noted above) were discussed with patient    Plan also discussed with team members including:  attending and resident    Attending Attestation:  I attest that the patient understands the risks/benefits of the procedure/anesthetic. I attest  that I prescribed the anesthetic plan.   Laymond PurserKEITH Kaled Allende, MD 8:49 PM

## 2016-05-07 NOTE — Progress Notes (Signed)
Report given to Allyson Bailey, RN who assumed care of patient at this time.  Helen Naim J Seanna Sisler, RN

## 2016-05-07 NOTE — OB Triage Note (Signed)
OBSTETRICS TRIAGE NOTE      Chief Complaint: Leaking    HPI     Helen Morrison is a 30 y.o. G3P1011 at [redacted]w[redacted]d with pregnancy complicated by risks outlined below who presents with leaking. Patient notes that she woke at 1 am with, not a gush of fluid, but some leaking. Has not continued to leak, has not needed a pad, does not leak when she walks. Denies vaginal bleeding or contractions.     Patient had elevated blood pressure of 172/76 initially in triage. On recheck, 140/67 and then an hour lateer 136/70. Denies RUQ pain, headaches or changes in vision.         Pregnancy Risks     Hx of VAVD    Obesity   - BMI 30    LSIL pap  - Will need post partum colpo    Obstetrical History     OB History   Gravida Para Term Preterm AB Living   3 1 1  0 1 1   SAB TAB Ectopic Multiple Live Births   1 0 0 0 1      # Outcome Date GA Lbr Len/2nd Weight Sex Delivery Anes PTL Lv   3 Current            2 Term 10/17/12 5167w5d 18:38 / 03:13 2829 g (6 lb 3.8 oz) M Vag-Vacuum EPIDURAL-,Pud,Local N LIV      Complications: Chorioamnionitis      Birth Comments: failed forceps,    1 SAB 2011                    PMH, PSH, SH, GYN History, Allergies, and Current Medications reviewed and updated in eRecord.       Review of Systems     Constitutional: Negative for fever or chills.  Cardiovascular/Respiratory: Denies chest pain or shortness of breath.  Gastrointestinal: Denies nausea/vomiting or abdominal pain.   Musculoskeletal: Denies muscle weakness.  Neurologic: Denies headaches or vision changes.   Genitourinary: Denies dysuria or hematuria. Denies vaginal bleeding.          Prenatal Labs           Lab results: 04/16/16  1646 12/26/15  1537   Group B Strep Culture Streptococcus agalactiae (Group B) detected*  --    HIV 1&2 ANTIGEN/ANTIBODY  --  Nonreactive   N. gonorrhoeae DNA Amplification .  --    Chlamydia Plasmid DNA Amplification .  --         Lab results: 03/26/16  1757   Glucose,50gm 1HR 98              Physical Exam     Vitals:     05/07/16 1130 05/07/16 1139 05/07/16 1236   BP: (!) 172/76 140/67 136/70   Pulse: 93 86 91   Resp: 18     Temp:  36.2 C (97.2 F)    TempSrc:  Temporal    SpO2: 97%         General Appearance: active, no distress and cooperative  Mental Status: Alert and oriented x 3  HEENT: Normocephalic, atraumatic.  Cardiovascular: Regular rate  Respiratory: Unlabored work of breathing  Abdomen: Soft, gravid, non-tender.      Neurological: Grossly intact  Extremities: Minimal edema noted   External Genitalia: Unremarkable external genitalia without lesions.  Normal appearing labia majora/minora and introitus.  Vagina: Normal appearing vaginal mucosa.  No discharge, bleeding or leakage of fulid  Cervix: Cervical examination as per below.  Cervical Examination: 4/50/-2    Sterile Speculum Exam:  Pooling: negative  Nitrazine: NA  Ferning: negative  Membranes: unclear  Wet Prep: no pathogens    Estimated Fetal Weight: 3400 grams by Thayer OhmLeopold maneuvers    Presentation: cephalic by ultrasound    Fetal Monitoring:  Baseline: 140 bpm  Variability: Moderate (6-25 BPM)  Accelerations: Yes 15X15  Decelerations: None  Category: 1  Toco: None       Assessment & Plan     Helen Morrison is a 30 y.o. G3P1011 at 773w6d with pregnancy complicated by risks outlined above who presents with complaint of leaking of fluid and had elevated pressures.     1. EFM: Reactive NST. Category 1 fetal heart tracing.  2. Tocometer: Few contractions   3. SSE/Wet Prep: No lesions,   4. Labs/Swabs Collected: HELLP, ROM plus, and urine spot pending   5. Plan: Unlikely ruptured however, waiting to confirm with ROM +. HELLP labs sent, will send urine once we are sure patient is not ruptured.     D/w Dr. Isac CaddyZannat     Jaydence Arnesen, MD  OB/GYN Resident, R2  Pager # [phone]

## 2016-05-07 NOTE — OB Triage Note (Signed)
OBSTETRICS ADMISSION HISTORY & PHYSICAL      Reason for Admission (Chief Complaint): Leakage of fluid     HPI     Helen Morrison is a 30 y.o. G3P1011 at 70w6dwith pregnancy complicated by risks outlined below who presents with leaking. Patient notes that she woke at 1 am with, not a gush of fluid, but some leaking. Has not continued to leak, has not needed a pad, does not leak when she walks. Denies vaginal bleeding or contractions.     Patient had elevated blood pressure of 172/76 initially in triage. On recheck, 140/67 and then an hour lateer 136/70. Denies RUQ pain, headaches or changes in vision.       Pregnancy Risks     Hx of VAVD    Obesity   - BMI 30    LSIL pap  - Will need post partum colpo    Past Medical History     Past Medical History:   Diagnosis Date    Abnormal Pap smear     last negative 04/2012    Anemia     Migraines     Scoliosis     Vitamin D deficiency        Past Surgical History     Past Surgical History:   Procedure Laterality Date    no fam hx of anes complic      as of 44/25/9563   no hx of anes complic      as of 48/75/6433      Obstetrical History     OB History   Gravida Para Term Preterm AB Living   '3 1 1 ' 0 1 1   SAB TAB Ectopic Multiple Live Births   1 0 0 0 1      # Outcome Date GA Lbr Len/2nd Weight Sex Delivery Anes PTL Lv   3 Current            2 Term 10/17/12 [id number]:38 / 03:13 2829 g (6 lb 3.8 oz) M Vag-Vacuum EPIDURAL-,Pud,Local N LIV      Complications: Chorioamnionitis      Birth Comments: failed forceps,    1 SAB 2011                  Allergies     Allergies   Allergen Reactions    Pcn [Penicillins] Swelling       Current Home Medications     Prior to Admission medications    Medication Sig Start Date End Date Taking? Authorizing Provider   butalbital-acetaminophen-caffeine (FIORICET, ESGIC) 50808-445-4917G per tablet Take 1 tablet by mouth 4 times daily as needed for Headaches or Migraine 12/26/15   Monachino, KeClaiborne Billings, NP   cetirizine (ZYRTEC) 10 MG tablet  Take 1 tablet (10 mg total) by mouth daily 11/28/15 11/27/16  BuDwan BoltMD   fluticasone (VERAMYST) 27.5 MCG/SPRAY nasal spray 2 sprays by Each Nare route daily 11/28/15   BuDwan BoltMD   folic acid (FOLVITE) 40166CG tablet Take 1 tablet (400 mcg total) by mouth daily 10/31/15 08/11/16  BuDwan BoltMD   Prenatal Vit-Fe Fumarate-FA (PRENATAL FORMULA) 27-1 MG tablet Take 1 tablet by mouth daily 09/19/15   MoLilia Argue, NP   ergocalciferol (DRISDOL) 50000 UNIT capsule Take 1 capsule (50,000 Units total) by mouth once a week 10/03/14   PaPatrici RanksMD       GYN History, Social History, and Family History reviewed and updated in eRecord.  Review of Systems     Constitutional: Negative for fever or chills  Cardiovascular: Denies chest pain   Respiratory: Denies shortness of breath   Gastrointestinal: Denies nausea/vomiting or abdominal pain.    Musculoskeletal: Denies muscle weakness.  Skin/Extremities: Denies peripheral edema.    Neurologic: Denies headaches or vision changes.   Genitourinary: Denies dysuria.  Denies urinary frequency or urgency.  Denies hematuria.  Denies flank pain.  Denies vaginal bleeding.            Prenatal Labs       Recent Labs  Lab 05/07/16  1304   WBC 9.7   Hemoglobin 11.6   Hematocrit 35   Platelets 217       Recent Labs  Lab 05/07/16  1304   Sodium 139   Potassium 4.3   Chloride 104   CO2 23   UN 6   Creatinine 0.57   GFR,Caucasian 123   Glucose 74      Recent Labs  Lab 05/07/16  1304   LD 184   Urate 4.0   ALT 19   AST 20   Alk Phos 147*   Bilirubin,Total 0.4               Lab results: 04/16/16  1646 12/26/15  1537   Group B Strep Culture Streptococcus agalactiae (Group B) detected*  --    HIV 1&2 ANTIGEN/ANTIBODY  --  Nonreactive   N. gonorrhoeae DNA Amplification .  --    Chlamydia Plasmid DNA Amplification .  --         Lab results: 03/26/16  1757   Glucose,50gm 1HR 98      No results for input(s): GL0, GL1H, [place], [place] in the last 8760 hours.             Physical Exam     Vitals:    05/07/16 1130 05/07/16 1139 05/07/16 1236   BP: (!) 172/76 140/67 136/70   Pulse: 93 86 91   Resp: 18     Temp:  36.2 C (97.2 F)    TempSrc:  Temporal    SpO2: 97%         General Appearance: alert, no distress and cooperative  Mental Status: Alert and oriented x 3  HEENT: Normocephalic, atraumatic.  Cardiovascular: Regular rate  Respiratory: Unlabored work of breathing  Abdomen: Soft, gravid, non-tender.      Neurological: Grossly intact  Extremities: normal  External Genitalia: Unremarkable external genitalia without lesions.  Normal appearing labia majora/minora and introitus.  Vagina: Pink, ruggated vaginal mucosa without lesions.  No discharge or bleeding.  Cervix: Cervical examination as per below.  No lesions or leakage of fluid.    Sterile Speculum Exam:  Pooling: negative  Nitrazine: NA  Ferning: negative  Membranes: unclear  Wet Prep: no pathogens    Estimated Fetal Weight: 3500 grams by Curlene Labrum maneuvers    Placental location: anterior    Presentation: cephalic by ultrasound    Fetal Monitoring:  Baseline: 140 bpm  Variability: Moderate (6-25 BPM)  Accelerations: Yes 15X15  Decelerations: None  Category: 1  Toco: None     Assessment & Plan     Helen Morrison is a 30 y.o. G3P1011 at 79w6dwith pregnancy complicated by risks outlined above admitted for PROM. ROM+ was positive, will admit patient for IOL.     Admit to 316   - Insert IV   - CBC, T&S, and Syphilis screen sent on admission.   - Cervix:  4 / Membranes: ruptured   - Presentation: vertex / EFW: 3500 by Leopolds   - Category 1 fetal heart tracing.  Intermittent EFM.   - Risk of PPH is: Low (Type & Screen)    Labor Plan   - Will start antibiotics, once treated will start pitocin    Elevated Pressures   - HELLP wnl, discount previously sent spot, was not cathed, likely has protein from amniotic fluid.      Postpartum planning   - Rh positive / Rubella immune /HIV negative / GBS positive   - Infant: female.  Circumcision: requested.   - Feeding: breast   - PPBC: immediate IUD, already ordered    - Immunizations: Up to date    D/w Dr. Amalia Greenhouse, MD  OB/GYN Resident, R2  Pager # [phone]

## 2016-05-07 NOTE — Progress Notes (Signed)
OBSTETRICS PROGRESS NOTE       Subjective     Feeling contractions about every 20 minutes, otherwise comfortable. Would like epidural       Objective     Vitals:    05/07/16 1623 05/07/16 1626 05/07/16 1803 05/07/16 1854   BP: 119/72   129/72   Pulse: 91   99   Resp: 18  18 18    Temp: 36.7 C (98.1 F)  36.7 C (98.1 F) 36.9 C (98.4 F)   TempSrc: Temporal  Temporal Temporal   SpO2:       Weight:  104.8 kg (231 lb)     Height:  1.702 m (5' 7.01")         Most recent cervical exam:   OB Examiner: Grevlinger (05/07/16 1221)  **Dilation: 4  **Effacement (%): 80  **Station: -2    Membranes:   Membrane Status: ? ROM      Rupture Date: 05/07/16  Rupture Time: 0100    Fetal Monitoring:  Baseline: 140 bpm  Variability: moderate  Accelerations: present  Decelerations: None  Category: 1  Toco: none      Assessment & Plan     Helen Morrison is a 30 y.o. G3P1011 at 2568w6d admitted for PROM.    1. FHT: Category I..  2. Labor assessment: will start pitocin for induction.  3. Pain management: patient will receive epidural soon.  4. GBS status: positive. Vanc prophylaxis started @ 3:40pm.  5. Labor Risks:   - gHTN - HELLP labs normal on admit, will get urine spot with cath after epidural  - H VAVD  - Obesity with BMI 30      Helen MannanAlex Debroah Shuttleworth, MD  Obstetrics and Gynecology Resident, PGY1  Pager: [phone]  05/07/16   7:59 PM

## 2016-05-07 NOTE — Progress Notes (Signed)
OBSTETRICS PROGRESS NOTE       Subjective     Strip review      Objective     Vitals:    05/07/16 2055 05/07/16 2057 05/07/16 2059 05/07/16 2130   BP: 132/58 129/61 119/65 106/58   Pulse: (!) 117 109 107 85   Resp:   18    Temp:   37.1 C (98.8 F)    TempSrc:       SpO2:       Weight:       Height:           Most recent cervical exam:   OB Examiner: Ettore Trebilcock MD (05/07/16 1935)  **Dilation: 5  **Effacement (%): 80  **Station: -2    Membranes:   Membrane Status: ? ROM      Rupture Date: 05/07/16  Rupture Time: 0100    Fetal Monitoring:  Baseline: 130 bpm  Variability: moderate  Accelerations: present  Decelerations: None  Category: 1  Toco: q4-5 min      Assessment & Plan     Helen Morrison is a 30 y.o. G3P1011 at 384w6d admitted for PROM.    1. FHT: Category I..  2. Labor assessment: pitocin running, currently at 4  3. Pain management: s/p epidural  4. GBS status: positive. Vanc prophylaxis started @ 3:40pm.  5. Labor Risks:   - gHTN - HELLP labs normal on admit, urine spot pending  - H VAVD  - Obesity with BMI 30      Helen MannanAlex Josalynn Johndrow, MD  Obstetrics and Gynecology Resident, PGY1  Pager: [phone]  05/07/16   10:20 PM

## 2016-05-08 ENCOUNTER — Encounter: Payer: Self-pay | Admitting: Obstetrics and Gynecology

## 2016-05-08 LAB — SYPHILIS SCREEN
Syphilis Screen: NEGATIVE
Syphilis Status: NONREACTIVE

## 2016-05-08 LAB — TP/CREATININE RATIO,UR: TP Creatinine ratio,UR: 0.18

## 2016-05-08 LAB — HEPATITIS B SURFACE ANTIGEN: HBV S Ag: NEGATIVE

## 2016-05-08 LAB — PROTEIN, URINE: Protein,UR: 60 mg/dL — ABNORMAL HIGH (ref 0–11)

## 2016-05-08 LAB — CREATININE, URINE: Creatinine,UR: 333 mg/dL — ABNORMAL HIGH (ref 20–300)

## 2016-05-08 MED ORDER — DOCUSATE SODIUM 100 MG PO CAPS *I*
100.0000 mg | ORAL_CAPSULE | Freq: Two times a day (BID) | ORAL | 0 refills | Status: DC | PRN
Start: 2016-05-08 — End: 2016-08-05

## 2016-05-08 MED ORDER — ELECTRIC BREAST PUMP (FOR PERSONAL USE) *A*
0 refills | Status: AC
Start: 2016-05-08 — End: ?

## 2016-05-08 MED ORDER — LACTATED RINGERS IV SOLN *I*
50.0000 mL/h | INTRAVENOUS | Status: AC
Start: 2016-05-08 — End: 2016-05-08
  Administered 2016-05-08: 50 mL/h via INTRAVENOUS

## 2016-05-08 MED ORDER — ACETAMINOPHEN 325 MG PO TABS *I*
650.0000 mg | ORAL_TABLET | Freq: Four times a day (QID) | ORAL | Status: DC | PRN
Start: 2016-05-08 — End: 2016-05-08

## 2016-05-08 MED ORDER — BENZOCAINE-MENTHOL 20-0.5 % EX AERO *I*
INHALATION_SPRAY | CUTANEOUS | Status: DC | PRN
Start: 2016-05-08 — End: 2016-05-10
  Filled 2016-05-08: qty 60

## 2016-05-08 MED ORDER — IBUPROFEN 600 MG PO TABS *I*
600.0000 mg | ORAL_TABLET | Freq: Four times a day (QID) | ORAL | 0 refills | Status: AC | PRN
Start: 2016-05-08 — End: ?

## 2016-05-08 MED ORDER — IBUPROFEN 600 MG PO TABS *I*
600.0000 mg | ORAL_TABLET | Freq: Four times a day (QID) | ORAL | Status: DC | PRN
Start: 2016-05-08 — End: 2016-05-10
  Administered 2016-05-08 – 2016-05-10 (×5): 600 mg via ORAL
  Filled 2016-05-08 (×5): qty 1

## 2016-05-08 MED ORDER — HYDROCORTISONE ACE-PRAMOXINE 2.5-1 % RE CREA *I*
TOPICAL_CREAM | CUTANEOUS | Status: DC | PRN
Start: 2016-05-08 — End: 2016-05-10
  Filled 2016-05-08: qty 30

## 2016-05-08 MED ORDER — DOCUSATE SODIUM 100 MG PO CAPS *I*
100.0000 mg | ORAL_CAPSULE | Freq: Two times a day (BID) | ORAL | Status: DC | PRN
Start: 2016-05-08 — End: 2016-05-10
  Administered 2016-05-09: 100 mg via ORAL
  Filled 2016-05-08: qty 1

## 2016-05-08 MED ORDER — PROMETHAZINE HCL 12.5 MG PO TABS *I*
12.5000 mg | ORAL_TABLET | Freq: Four times a day (QID) | ORAL | Status: DC | PRN
Start: 2016-05-08 — End: 2016-05-10
  Filled 2016-05-08 (×16): qty 1

## 2016-05-08 MED ORDER — CALCIUM CARBONATE ANTACID 500 MG PO CHEW *I*
1000.0000 mg | CHEWABLE_TABLET | Freq: Three times a day (TID) | ORAL | Status: DC | PRN
Start: 2016-05-08 — End: 2016-05-10

## 2016-05-08 MED ORDER — POLYETHYLENE GLYCOL 3350 PO PACK 17 GM *I*
17.0000 g | PACK | Freq: Every day | ORAL | Status: DC | PRN
Start: 2016-05-08 — End: 2016-05-10

## 2016-05-08 MED ORDER — IBUPROFEN 600 MG PO TABS *I*
600.0000 mg | ORAL_TABLET | Freq: Four times a day (QID) | ORAL | Status: DC | PRN
Start: 2016-05-08 — End: 2016-05-08

## 2016-05-08 MED ORDER — ONDANSETRON HCL 4 MG PO TABS *I*
4.0000 mg | ORAL_TABLET | Freq: Three times a day (TID) | ORAL | Status: DC | PRN
Start: 2016-05-08 — End: 2016-05-10

## 2016-05-08 MED ORDER — OXYTOCIN 30 UNITS/500 ML NS *POSTPARTUM* *I*
100.0000 m[IU]/min | INTRAMUSCULAR | Status: AC
Start: 2016-05-08 — End: 2016-05-08
  Administered 2016-05-08: 100 m[IU]/min via INTRAVENOUS

## 2016-05-08 MED ORDER — LIDOCAINE HCL 1 % IJ SOLN *I*
30.0000 mL | INTRAMUSCULAR | Status: DC | PRN
Start: 2016-05-08 — End: 2016-05-10

## 2016-05-08 MED ORDER — ACETAMINOPHEN 325 MG PO TABS *I*
650.0000 mg | ORAL_TABLET | Freq: Four times a day (QID) | ORAL | Status: DC | PRN
Start: 2016-05-08 — End: 2016-05-10

## 2016-05-08 MED ORDER — DIPHENHYDRAMINE HCL 25 MG PO TABS *I*
25.0000 mg | ORAL_TABLET | Freq: Every evening | ORAL | Status: DC | PRN
Start: 2016-05-08 — End: 2016-05-10

## 2016-05-08 NOTE — Progress Notes (Signed)
OBSTETRICS PROGRESS NOTE       Subjective     Patient feeling pain and pressure, desires epidural rebolus      Objective     Vitals:    05/07/16 2302 05/07/16 2330 05/08/16 0000 05/08/16 0031   BP: 108/59 119/62 125/85 131/74   Pulse: 88 93 84 87   Resp: 18      Temp: 36.7 C (98.1 F)      TempSrc: Temporal      SpO2:       Weight:       Height:           Most recent cervical exam:   OB Examiner: Helen Morrison (05/08/16 0020)  **Dilation: 9  **Effacement (%): 80  **Station: -2    Membranes:   Membrane Status: ? ROM      Rupture Date: 05/07/16  Rupture Time: 0100    Fetal Monitoring:  Baseline: 130 bpm  Variability: moderate  Accelerations: present  Decelerations: occasional early decels  Category: 1  Toco: q2-3 min      Assessment & Plan     Helen Morrison is a 30 y.o. G3P1011 at 218w6d admitted for PROM.    1. FHT: Category I.  2. Labor assessment: pitocin running, currently at 10  3. Pain management: s/p epidural, getting rebolus  4. GBS status: positive. Vanc prophylaxis started @ 3:40pm.  5. Labor Risks:   - gHTN - HELLP labs normal on admit, urine spot pending  - H VAVD  - Obesity with BMI 30      Helen MannanAlex Jamaal Bernasconi, MD  Obstetrics and Gynecology Resident, PGY1  Pager: [phone]  05/08/16   12:35 AM

## 2016-05-08 NOTE — Lactation Note (Signed)
This note was copied from a baby's chart.  Lactation Consultant Initial Visit   Patient: Helen Morrison " "          Age: 30 years                MRN: [id number]     Maternal Information     Mothers Name:   Herbert MoorsShaunakay Kingsford    Visit Type: Admission Note/First face to face with mother   Support Person Present: yes    Delivery Date/Time: 05/08/2016 1:14 AM Delivery Type: Vaginal, Spontaneous Delivery     Contributing Maternal Medical History:    Past Medical History:   Diagnosis Date    Abnormal Pap smear     last negative 04/2012    Anemia     Migraines     Scoliosis     Vitamin D deficiency      Past Surgical History:   Procedure Laterality Date    no fam hx of anes complic      as of 10/16/2012    no hx of anes complic      as of 10/16/2012     Family History   Problem Relation Age of Onset    Hypertension Mother     Diabetes Maternal Aunt     Diabetes Paternal Aunt     Arthritis Paternal Aunt      Social History     Social History    Marital status: Single     Spouse name: N/A    Number of children: N/A    Years of education: N/A     Social History Main Topics    Smoking status: Never Smoker    Smokeless tobacco: Never Used    Alcohol use No      Comment: occasionally    Drug use: No    Sexual activity: Yes     Partners: Male     Birth control/ protection: Condom, None     Other Topics Concern    None     Social History Narrative       Information for the patient's mother:  Herbert MoorsMitchell, Eloni [[id number]][[id number]]     Obstetric History    G3   P2   T2   P0   A1   L2     SAB1   TAB0   Ectopic0   Multiple0   Live Births2              Maternal Medications Eval: current maternal medications compatible with breastfeeding .  Contributing Baby Medical History: see admission    Plans to BF for uncertain.  Breastfeeding History: experienced mother, breastfed other child for one month.  Breast Shield/Flange Size: not assessed at this time      Maternal Exam:  Breast Assessment: no maternal concerns.  Nipple Assessment:  within normal limits.    Newborn Assessment     Sex: female   GA: 3151     [place]: AGA  Apgars: 9 and 9  Disposition: [facility]   Pediatrician: Juanito DoomHerendeen, Neil, MD  Ped Phone: [phone]     Infant Weight:      Birth Weight: 3175 g (7 lb)     Today's Weight: 3175 g (7 lb) (Filed from Delivery Summary)     Percent Weight Change: 0 %      Feedings: skin to skin  newborn to breast  I/O Attempt Pump Breastfeeding Breastmilk Formula Voids Stools   Last 12 hrs 0 0 4  0 0 2 2          Quality of breast feeds:  infant breastfeeding well, sustained latch and suckle, swallowing noted, as observed by LC.    Oral Assessment: initially tongue thrusting    Interventions and Instructions   1:1 with baby to breast, initially tongue thrusting. Did well with assist. Reinforced deep latch, encouraged to call for assist.    Reviewed: breast/nipple care, positioning, areolar expression, correct attachment, frequency/length of feeds, baby feeding cues, signs of suckling/letdown, waking techniques, signs of adequate infant intake, suck training, resources for help, cue based feeding and feeding cues, rooming in, skin to skin prior to feeds, reviewed educational video brochure     Plan   Follow-up with lactation daily while mother/infant dyad inpatient or by maternal request.  Length of this call/visit: 25-30 minutes     Alejandro MullingJanice  Kalista Laguardia, RN  Lactation Consultant

## 2016-05-09 LAB — HEMATOCRIT: Hematocrit: 30 % — ABNORMAL LOW (ref 34–45)

## 2016-05-09 LAB — MCHC: MCHC: 34 g/dL (ref 32–36)

## 2016-05-09 MED ORDER — INFLUENZA VAC SPLIT QUAD (FLULAVAL) 0.5 ML IM SUSY PF(>=6 MONTHS) *I*
0.5000 mL | PREFILLED_SYRINGE | INTRAMUSCULAR | Status: DC
Start: 2016-05-09 — End: 2016-05-10
  Filled 2016-05-09: qty 0.5

## 2016-05-09 NOTE — Progress Notes (Signed)
OBSTETRICS VAGINAL DELIVERY PROGRESS NOTE   Postpartum Day: 1    Subjective     Helen Morrison is doing well.  Pain is well-controlled with current regimen.  Tolerating regular diet without nausea/vomiting.  She has ambulated.  Denies chest pain, SOB, or lightheadedness.  Appropriate vaginal bleeding.  Baby is doing well.      Objective     Vitals:    05/08/16 1200 05/08/16 1645 05/08/16 1945 05/08/16 2351   BP: (!) 138/91 138/73 129/72 131/77   Pulse: 84 84 91 80   Resp: 18 18 18 18    Temp: 37 C (98.6 F) 37.3 C (99.1 F) 36.9 C (98.4 F) 36.8 C (98.2 F)   TempSrc: Temporal Temporal Temporal Temporal   SpO2:       Weight:       Height:           Mental Status: Alert and oriented x 3  Cardiovascular: regular rate  Respiratory: normal work of breathing on room air  Abdomen: deferred, mom breastfeeding at this time   Neurological: Normal, average response (2+)  Extremities/Skin: No edema noted      Labs       Recent Labs  Lab 05/07/16  1304   Hematocrit 35       ABO RH Blood Type (no units)   Date Value   05/07/2016 O RH POS                  Rubella IgG AB (no units)   Date Value   03/23/2012 POSITIVE         Assessment & Plan     [name] is a 30 y.o. Z6X0960G3P2012 on PPD# 1 s/p Vaginal, Spontaneous Delivery  at 5336w0d.  Doing well.    Routine postpartum care:  - Rh status as above. Rhogam is not indicated.    - Infant: female. Remains undecided re: circumcision.  - Feeding: breast  - PPBC: immediate PP IUD in place  - Immunizations: s/p tdap, flu ordered    New onset gHTN  - BPs normal to mild overnight  - HELLP labs wnl  - spot 0.18    Hx of VAVD     Obesity   - BMI 30     LSIL pap  - Will need post partum colposcopy       Disposition: Plan for D/C home PPD # 2.    Bridgitte Felicetti, MD  PGY-2

## 2016-05-09 NOTE — Lactation Note (Signed)
This note was copied from a baby's chart.  Lactation Consultant Daily Visit   Patient: Helen Morrison          AGE: 31 days                MRN: [id number]      Maternal Information   Mothers Name:   Helen Morrison    Visit Type: routine/daily     Maternal Medications: current maternal medications compatible with breastfeeding     Breast Assessment: no maternal concerns    Nipple Assessment: no maternal concerns    Newborn Assessment   Infant Weight:      Birth Weight: 3175 g (7 lb)     Today: 3090 g (6 lb 13 oz)     Percent Weight Change: -2.67 %      Feedings: skin to skin  newborn to breast    I/O Attempts Pump Breastfeeding Expressed  Breastmilk Formula Voids Stools   Last 24 hrs 0 0 10 0 0 3 4      Quality of breast feeds:  infant breastfeeding well, as reported by mother.    Oral Assessment: unable to assess    Maternal and Newborn Individual Risk Factors impacting Breastfeeding:   None    Interventions and Instructions   Mother stated baby is nursing well, denies difficulty.    Plan   Follow-up with lactation daily while mother/infant dyad inpatient or by maternal request.  Length of this call/visit: 0-5 minutes       Helen MaterAnnette R Breeley Bischof, RN  Lactation Consultant

## 2016-05-09 NOTE — Anesthesia Postprocedure Evaluation (Signed)
Anesthesia Post-Op Note    Patient: Helen Morrison    Procedure(s) Performed:  Procedure Summary     Date Anesthesia Start Anesthesia Stop Room / Location    05/07/16 2024 0114 (05/08/16)        Procedure Diagnosis Scheduled Providers Attending Anesthesia    LABOR ANALGESIA No diagnosis on file.  Laymond PurserFranklin, Keith, MD        Recovery Vitals  BP: 131/77 (05/08/2016 11:51 PM)  Heart Rate: 80 (05/08/2016 11:51 PM)  Resp: 18 (05/08/2016 11:51 PM)  Temp: 36.8 C (98.2 F) (05/08/2016 11:51 PM)  SpO2: 97 % (05/07/2016 11:30 AM)   0-10 Scale: 0 (05/09/2016  4:00 AM)  Anesthesia type:  Epidural  Complications Noted During Procedure or in PACU:  None   Comment:    Patient Location:  Labor and Delivery  Level of Consciousness:    Recovered to baseline  Patient Participation:     Able to participate  Temperature Status:    Normothermic  Oxygen Saturation:    Within patient's normal range  Cardiac Status:   Within patient's normal range  Fluid Status:    Stable  Airway Patency:     Yes  Pulmonary Status:    Baseline  Neuraxial Block Evaluation:    No residual motor or sensory symptoms  Pain Management:    Adequate analgesia  Nausea and Vomiting:  None    Post Op Assessment:    Tolerated procedure well   Attending Attestation:  All indicated post anesthesia care provided     -

## 2016-05-09 NOTE — Progress Notes (Signed)
Daylight savings time occurred on this day. Events and notes between 1:00AM and 3:00AM may be out of order in the chart, where possible documentation was adjusted accordingly.

## 2016-05-10 LAB — RUBELLA ANTIBODY, IGG: Rubella IgG AB: POSITIVE

## 2016-05-10 NOTE — Discharge Instructions (Signed)
Active Hospital Problems    Diagnosis    SVD (spontaneous vaginal delivery)    Pregnancy      Resolved Hospital Problems    Diagnosis   No resolved problems to display.       Brief Summary of Your Hospital Course (including key procedures and diagnostic test results):  NSVD       Written instructions provided: {instructions:[id number]::"Welcome to [facility]"}    Call your OB provider promptly if you experience any of the symptoms in the pre-written guidelines. If you cannot reach your MD/CNM, call their answering service or 9-1-1.    Diet: per pre-written guidelines.    Activity:  Per pre-written guidelines.    Teacher, early years/preMedical Equipment / Supplies  None    [employer]  WIC (Complete WIC form)

## 2016-05-10 NOTE — Progress Notes (Signed)
In to see pt ZO:[id number]:excessively filled breast. Mom states she is "engorged... My breasts are TOO FULL!" Mom states that she has been pumping and her breast" just make too much milk".  Breasts noted to be very full and firm to the touch and slightly warm. Mom states she has been using ice packs after pumping. This Clinical research associatewriter assisted mom with soaking her breast in warm water to help alleviate some of the fullness. Also, applied warm packs to breast and assisted mom with hand expression. Spoke with IArther Dames. Bromberg,Rn her night nurse. Explained our interventions and suggested mom use ice packs after. Mom verbalized understanding. I.Bromberg in agreement with plan. Will continue to monitor.

## 2016-05-10 NOTE — Progress Notes (Signed)
[facility]   Routine OB Check    Doing OK.  Worrying about the size of the baby - does NOT want a vacuum-assisted delivery especially given that she had a 3rd degree laceration last time.  She felt the delivery seemed chaotic and that she didn't understand the risks and benefits - would prefer a c-section over another operative delivery.      She is worried the baby won't fit - she feels this baby is bigger than her last and she has gained 35# this pregnancy.  Requesting an USN for G&F.    Denies regular, painful contractions (has had some irregular ones here or there), LOF, VB and reports normal fetal movement.    PHYSICAL EXAM:  Vitals:    04/30/16 1438   BP: 132/77   Pulse: 83   Weight: 104.8 kg (231 lb)   Height: 1.702 m (5' 7.01")       Gen: Well-appearing pregnant female in NAD  Abd: Gravid, no fundal tenderness    Doptones: 148 bpm  Fundal Height: 39 cm    ASSESSMENT/PLAN:  30 y.o. Z6X0960G3P2012 at 8251w5d presents for routine OBC    Pregnancy Risks:  H/o VAVD (2829 grams, maternal exhaustion)  H/o 3rd degree perineal laceration  GBS+ anaphylactic PCN allergy - no sensitivities obtained (plan vanco)  LSIL pap - needs PP colposcopy  Obesity (pre-preg BMI 30)    1. Routine Prenatal Care  - routine labs UTD, received TDAP, declines flu  - Boy, circ, breast, pp progesterone IUD    <> USN for G&F for delivery planning    [facility]    Webb SilversmithKristen Nolah Krenzer MD  OB/GYN Attending Physician  Pager # [phone]

## 2016-05-10 NOTE — Progress Notes (Signed)
OBSTETRICS VAGINAL DELIVERY PROGRESS NOTE   Postpartum Day: 2    Subjective     Helen Morrison is doing well, though she notes breast pain. She desires to talk to lactation today.     Tolerating regular diet without nausea/vomiting.  She has ambulated.  Denies chest pain, SOB, or lightheadedness.  Appropriate vaginal bleeding.  Baby is doing well.      Objective     Vitals:    05/09/16 1200 05/09/16 1600 05/09/16 1940 05/09/16 2340   BP: 131/73 131/79 129/67 (!) 145/73   Pulse: 79 77 91 102   Resp: 18 18 18 18    Temp: 38 C (100.4 F) 38 C (100.4 F) 37.5 C (99.5 F) 37.8 C (100 F)   TempSrc: Temporal Temporal Temporal Temporal   SpO2:       Weight:       Height:           Mental Status: Alert and oriented x 3  Cardiovascular: regular rate  Respiratory: normal work of breathing on room air  Breasts: normal contour, no abnormal masses. No erythema.   Abdomen: deferred, mom breastfeeding at this time   Neurological: Normal, average response (2+)  Extremities/Skin: No edema noted      Labs       Recent Labs  Lab 05/09/16  0645 05/07/16  1304   Hematocrit 30* 35       ABO RH Blood Type (no units)   Date Value   05/07/2016 O RH POS                  Rubella IgG AB (no units)   Date Value   03/23/2012 POSITIVE         Assessment & Plan     Helen Morrison is a 30 y.o. Z6X0960G3P2012 on PPD# 1 s/p Vaginal, Spontaneous Delivery  at 2143w0d.  Doing well.    Routine postpartum care:  - Rh status as above. Rhogam is not indicated.    - Infant: female.   - Feeding: breast  - PPBC: immediate PP IUD in place  - Immunizations: s/p tdap, flu ordered    New onset gHTN  - BPs normal to mild overnight  - HELLP labs wnl  - spot 0.18    Hx of VAVD    Obesity   - BMI 30    LSIL pap  - Will need post partum colposcopy     Disposition: Plan for D/C home PPD # 2.    Helen Bonsignore, MD  PGY-2

## 2016-05-11 MED FILL — Levonorgestrel Releasing IUD 19.5 MCG/DAY (52 MG Total): INTRAUTERINE | Qty: 1 | Status: AC

## 2016-05-11 NOTE — Discharge Summary (Signed)
Name: Helen MoorsShaunakay Rockford MRN: [id number] DOB: Dec 31, 1985     Admit Date: 05/07/2016   Date of Discharge: 05/10/2016    Patient was accepted for discharge to   Home or Self Care [1]           Discharge Attending Physician: Arthur HolmsLYNCH, TARA [facility]      Hospitalization Summary    CONCISE NARRATIVE:   Helen Morrison is a 30 y.o. year old now A2Z3086G3P2012 who was admitted at 3575w6d for PROM. She had an uncomplicated intrapartum course and delivered a female infant via spontaneous vaginal delivery. Her postpartum course was uneventful. Hct was stable on discharge. Discharged home in stable condition when meeting postpartum goals.          OTHER PROCEDURES/FINDINGS: SVD      Signed: Phillip HealAlexandra Erickson Kennedee Kitzmiller, MD  On: 05/11/2016  at: 4:38 PM

## 2016-05-31 ENCOUNTER — Ambulatory Visit
Admission: AD | Admit: 2016-05-31 | Discharge: 2016-05-31 | Disposition: A | Discharge status: Home / Self Care | Payer: Self-pay

## 2016-05-31 ENCOUNTER — Telehealth: Payer: Self-pay

## 2016-05-31 DIAGNOSIS — R102 Pelvic and perineal pain: Secondary | ICD-10-CM

## 2016-05-31 DIAGNOSIS — N898 Other specified noninflammatory disorders of vagina: Secondary | ICD-10-CM

## 2016-05-31 NOTE — Telephone Encounter (Signed)
Patient presented in office with forms to be filled out by provider. Patient would like to pick up forms when complete. Given to team secretary

## 2016-05-31 NOTE — UC Provider Note (Signed)
History     Chief Complaint   Patient presents with    Wound Infection     Patient recently had a vaginal delivery and has sutures in place. She thinks the incision is infected, painful.     Patient is a 30 y.o. female presenting with vaginal discharge.   History provided by:  Patient  Language interpreter used: No    Vaginal Discharge   Quality:  Watery and bloody  Severity:  Mild  Onset quality:  Sudden  Duration:  3 days  Chronicity:  New  Associated symptoms: vaginal itching    Associated symptoms: no abdominal pain, no dyspareunia, no dysuria, no fever, no genital lesions, no nausea, no rash, no urinary frequency, no urinary hesitancy, no urinary incontinence and no vomiting    The patient underwent spontaneous vaginal delivery of a baby on Nov 4th. She states she had a small tear and had some sutures placed after delivery. She reports very mild lochia postpartum however over the past 3 days is having some vaginal bleeding/spotting. She has an IUD in place and has not been sexually active since delivering her son. She states the outer vaginal folds and labia are sore and itchy. She is concerned that the sutures and wound may be infected. She is breastfeeding. She denies any rash to the area, swelling, erythema, fever, chill, sweats. She could not follow up with her OBGYN as she states "they do not accept walk in appointments."    Past Medical History:   Diagnosis Date    Abnormal Pap smear     last negative 04/2012    Anemia     Migraines     Scoliosis     Vitamin D deficiency             Past Surgical History:   Procedure Laterality Date    no fam hx of anes complic      as of 10/16/2012    no hx of anes complic      as of 10/16/2012       Family History   Problem Relation Age of Onset    Hypertension Mother     Diabetes Maternal Aunt     Diabetes Paternal Aunt     Arthritis Paternal Aunt          Social History    reports that she has never smoked. She has never used smokeless tobacco. She  reports that she currently engages in sexual activity and has had female partners. She reports using the following methods of birth control/protection: Condom and None. She reports that she does not drink alcohol or use illicit drugs.    Living Situation     Questions Responses    Patient lives with     Homeless     Caregiver for other family member     External Services     Employment     Domestic Violence Risk           Review of Systems   Review of Systems   Constitutional: Negative for fever.   Gastrointestinal: Negative for abdominal pain, constipation, diarrhea, nausea and vomiting.   Genitourinary: Positive for vaginal bleeding, vaginal discharge and vaginal pain. Negative for bladder incontinence, decreased urine volume, dyspareunia, dysuria, frequency, genital sores, hematuria, hesitancy, pelvic pain and urgency.   Musculoskeletal: Negative for back pain.   Skin: Positive for wound. Negative for rash.   All other systems reviewed and are negative.      Physical  Exam     ED Triage Vitals   BP Heart Rate Heart Rate (via Pulse Ox) Resp Temp Temp src SpO2 (Retired) O2 Device O2 Flow Rate   05/31/16 1258 05/31/16 1258 -- 05/31/16 1258 05/31/16 1258 05/31/16 1258 05/31/16 1258 -- --   126/72 76  16 37.4 C (99.3 F) TEMPORAL 100 %        Weight           05/31/16 1258           99.8 kg (220 lb)                    Physical Exam   Constitutional: She is oriented to person, place, and time. She appears well-developed and well-nourished. No distress.   HENT:   Head: Normocephalic and atraumatic.   Cardiovascular: Normal rate, regular rhythm and normal heart sounds.    Pulmonary/Chest: Effort normal and breath sounds normal. No respiratory distress.   Abdominal: Soft. Bowel sounds are normal. She exhibits no distension and no mass. There is no tenderness. There is no rebound, no guarding and no CVA tenderness.   Genitourinary:       There is tenderness on the right labia. There is no rash on the right labia. There is  tenderness on the left labia. There is no rash on the left labia. Cervix exhibits no motion tenderness. There is tenderness and bleeding in the vagina. No erythema in the vagina. No foreign body in the vagina. No signs of injury around the vagina. No vaginal discharge found.   Genitourinary Comments: Watery lochia noted in the vaginal vault. IUD string noted. Vaginal culture obtained.    Neurological: She is alert and oriented to person, place, and time.   Skin: Skin is warm and dry.   Psychiatric: She has a normal mood and affect. Her behavior is normal. Judgment and thought content normal.   Nursing note and vitals reviewed.       Medical Decision Making        Initial Evaluation:  ED First Provider Contact     Date/Time Event User Comments    05/31/16 1302 ED First Provider Contact Alysia PennaUIZ, Emillie Chasen M Initial Face to Face Provider Contact          Patient was seen on: 05/31/2016        Assessment:  30 y.o.female comes to the Urgent Care Center with vaginal pain, itching, bleeding x 3 days; s/p SVD of her son on 05/08/16, still has some sutures in place from a small vaginal tear.     Differential Diagnosis includes BV, candida, lochia, healing vaginal tear.                 Plan:   Orders Placed This Encounter    Vaginitis screen: DNA probe       No results found for this or any previous visit (from the past 24 hour(s)).       Final Diagnosis    ICD-10-CM ICD-9-CM   1. Vaginal pain R10.2 625.9   2. Vaginal discharge N89.8 623.5       Encourage fluids, encourage rest, good hand hygiene.    Please follow up with your physician as below:    Follow-up Information     Follow up with your OBGYN In 1 day.    Why:  For re-evaluation        If short of breath, chest pains, worsening symptoms, or any other concerns please report to  the emergency room.    In the event of an Emergency dial 911.     Final Diagnosis  Final diagnoses:   [R10.2] Vaginal pain (Primary)   [N89.8] Vaginal discharge           Concha Pyo, [place]    Supervising  physician Dorna Bloom, MD was immediately available     Concha Pyo, [place]  05/31/16 1759

## 2016-06-01 LAB — VAGINITIS SCREEN: DNA PROBE: Vaginitis Screen:DNA Probe: 0

## 2016-06-01 NOTE — Telephone Encounter (Signed)
Form completed and will be place in blue team hanging file.

## 2016-06-01 NOTE — Telephone Encounter (Signed)
Paperwork placed in blue team nurses biin to be completed.

## 2016-06-02 NOTE — Telephone Encounter (Signed)
LVMOM to call [facility]. If pt returns call please let them know that the forms are faxed and originals are placed in the accordion file at check in.

## 2016-06-07 ENCOUNTER — Telehealth: Payer: Self-pay

## 2016-06-07 NOTE — Telephone Encounter (Signed)
A user error has taken place: encounter opened in error, closed for administrative reasons.

## 2016-06-08 ENCOUNTER — Encounter: Payer: Self-pay | Admitting: Reproductive Endocrinology and Infertility

## 2016-06-08 ENCOUNTER — Ambulatory Visit: Payer: Self-pay | Admitting: Reproductive Endocrinology and Infertility

## 2016-06-08 VITALS — BP 119/68 | HR 76 | Temp 99.0°F | Ht 67.0 in | Wt 216.5 lb

## 2016-06-08 DIAGNOSIS — Z4802 Encounter for removal of sutures: Secondary | ICD-10-CM

## 2016-06-08 DIAGNOSIS — B372 Candidiasis of skin and nail: Secondary | ICD-10-CM

## 2016-06-08 MED ORDER — NYSTATIN 100000 UNIT/GM EX OINT *I*
TOPICAL_OINTMENT | Freq: Two times a day (BID) | CUTANEOUS | 0 refills | Status: DC
Start: 2016-06-08 — End: 2016-07-21

## 2016-06-08 NOTE — Progress Notes (Signed)
HPI:  Helen MoorsShaunakay Orellana is a 30 y.o. [id number] here with complaints of pain and concern for infection at suture site on her left labia.  She is s/p SVD on 05/08/16 with a superficial labial laceration that was repaired with one figure of eight stitch.  She states the area is painful and itches.  She was seen in urgent care on 05/31/16 for complaint of pain at site.      She also complaint of external genitalia itching.    Allergies   Allergen Reactions    Pcn [Penicillins] Swelling       Current Outpatient Prescriptions:     ibuprofen (ADVIL,MOTRIN) 600 MG tablet, Take 1 tablet (600 mg total) by mouth every 6 hours as needed, Disp: 30 tablet, Rfl: 0    electric breast pump, Use as directed.  Dx: post partum, Disp: 1 each, Rfl: 0    cetirizine (ZYRTEC) 10 MG tablet, Take 1 tablet (10 mg total) by mouth daily, Disp: 30 tablet, Rfl: 11    fluticasone (VERAMYST) 27.5 MCG/SPRAY nasal spray, 2 sprays by Each Nare route daily, Disp: 10 g, Rfl: 1    folic acid (FOLVITE) 400 MCG tablet, Take 1 tablet (400 mcg total) by mouth daily, Disp: 30 tablet, Rfl: 9    ergocalciferol (DRISDOL) 50000 UNIT capsule, Take 1 capsule (50,000 Units total) by mouth once a week, Disp: 4 capsule, Rfl: 1    nystatin (MYCOSTATIN) ointment, Apply topically 2 times daily, Disp: 15 g, Rfl: 0    docusate sodium (COLACE) 100 MG capsule, Take 1 capsule (100 mg total) by mouth 2 times daily as needed, Disp: 30 capsule, Rfl: 0    butalbital-acetaminophen-caffeine (FIORICET, ESGIC) 50-325-40 MG per tablet, Take 1 tablet by mouth 4 times daily as needed for Headaches or Migraine, Disp: 90 tablet, Rfl: 0    Prenatal Vit-Fe Fumarate-FA (PRENATAL FORMULA) 27-1 MG tablet, Take 1 tablet by mouth daily, Disp: 30 tablet, Rfl: 5  Patient Active Problem List    Diagnosis Date Noted    Supervision of normal pregnancy in first trimester 10/31/2015     Priority: Medium     [id number] with EDD 05/15/16 by LMP c/w 6 week USN.    Pregnancy Risks:  H/o VAVD - 2829  grams for maternal exhaustion; c/b 3rd degree obstetric laceration  Obesity (Pre-preg BMI 30)  LSIL pap - needs colposcopy postpartum  Back pain in pregnancy - exacerbation of sciatica requesting referral to PT    1. Routine Prenatal Care  - UCx neg, UTox neg  - Rh positive, Rubella immune  - CF neg / HgbElec WNL/ lead <1  - HIV/syphilis/HepBsAg neg  - GC/CT/trich neg  - anatomic USN completed: anterior placenta, CL 2.7cm.  No defects detected.  - genetic screening: STS neg  - declines flu vaccination during pregnancy, received TDAP 03/12/16  - Glucola 98  - GBS (36 weeks)- positive    Expecting a BOY, requesting circumcision  Planning to breastfeed  BC: Immediate Liletta IUD  Peds: <>      LSIL pap 11/10/2015     Priority: Low     LSIL pap    <> needs PP colposcopy      H/o VAVD - 1st delivery 2014 10/17/2012     Priority: Low    Scoliosis 07/11/2012     Priority: Low     patient reported      History of migraine headaches 04/13/2012     Priority: Low     [place] neurology in [place].  [place] establish [place] in [place].    Endorses some tension headaches this pregnancy, but no migraines.    Missed neurology appt      SVD (spontaneous vaginal delivery) 05/08/2016    Pregnancy 05/07/2016    Positive GBS test 04/19/2016       Review of Systems    CONSTITUTIONAL: Appetite good, no fevers, night sweats or weight loss  EYES: No visual changes  ENT: No hearing difficulties  CV: No chest pain, shortness of breath or peripheral edema  RESPIRATORY: No cough, wheezing or dyspnea  GI: No nausea/vomiting, abdominal pain, or change in bowel habits  GU: No dysuria, urgency or incontinence  MS: No joint pain/swelling or musculoskeletal deformities  SKIN: No rashes  NEURO: No MS changes, no motor weakness  PSYCH: No depression or anxiety  ENDOCRINE: No polyuria/polydipsia, no heat intolerance  HEME/LYMPH: No easy bleeding/bruising or swollen nodes  ALL/IMMUN: No allergic reactions      Objective  Vitals:     06/08/16 1103   BP: 119/68   Pulse: 76   Temp: 37.2 C (99 F)   Weight: 98.2 kg (216 lb 8 oz)   Height: 1.702 m (5\' 7" )     Physical Exam  Gen: [age] y.o. female, NAD  PELVIC: Normal external female genitalia. BUS WNL. A single stitch is intact centrally on the left labia.  Skin on the vulva is scaly, hypopigmented and has a thin-paper appearance  VAGINA: pink, small amount of pink tinged discharge      Assessment/Plan:    30 y.o. [id number] with retained labial stitch and yeast infection on the skin of the vulva.  - The singe stitch was cut below the knot and easily removed.  Pt tolerated the procedure well.  No bleeding noted.  - Prescription for nystatin ointment topically BID x 7 days given.  Wet prep was negative, no signs of vaginal yeast.  - Reminded to keep postpartum appointment as scheduled.  Ebany verbalized understanding of the treatment plan today and all questions were answered to her satisfaction.     Marguerita MerlesKELLY I Lashane Whelpley, NP

## 2016-06-11 LAB — FUNGUS CULTURE

## 2016-06-22 ENCOUNTER — Ambulatory Visit: Payer: Self-pay | Admitting: Dermatology

## 2016-06-22 VITALS — BP 120/78 | Ht 67.0 in | Wt 216.0 lb

## 2016-06-22 DIAGNOSIS — L219 Seborrheic dermatitis, unspecified: Secondary | ICD-10-CM

## 2016-06-22 DIAGNOSIS — R61 Generalized hyperhidrosis: Secondary | ICD-10-CM

## 2016-06-22 MED ORDER — ALUMINUM CHLORIDE 20 % EX SOLN *I*
Freq: Every evening | CUTANEOUS | 3 refills | Status: AC
Start: 2016-06-22 — End: ?

## 2016-06-22 MED ORDER — MOMETASONE FUROATE 0.1 % EX SOLN *A*
Freq: Every day | CUTANEOUS | 3 refills | Status: AC | PRN
Start: 2016-06-22 — End: ?

## 2016-06-22 NOTE — Patient Instructions (Signed)
Seborrheic dermatitis:  scalp  -Educated about diagnosis and treatment options  -start anti-dandruff shampoo that is over the counter and wash your hair as regularly as able  -Start mometasone lotion twice daily when scalp is itchy; avoid getting this liquid on your face      Increased sweating:  -drysol   -once complete with breast feeding call us and we can send in pills for you (oxybutynin)    Follow up:   as needed

## 2016-06-22 NOTE — Progress Notes (Addendum)
Referring Provider: Drue FlirtPatel, Surag Suresh, MD   PCP: Unknown, Provider     Chief complaint: Follow-up (Peeling, skin discoloration, itching on scalp)  -last seen 01/2015    Derm History:   2016:  Xerosis, mild face dermatitis, hyperhidrosis axilla (aluminum chloride)    HPI:   Helen Morrison is a pleasant 30 y.o. AA female here for the concern noted above.    Problem: itchy scalp  Location: as above  Duration: many months  Associated signs/symptoms: Itching and Enlarging  Modifying factors (prior treatments/current treatment):   -itching and scaling  -washes hair q2w using various oils  -had good response with aluminum chloride for hyperhidrosis but finds it cumbersome to use and would like oral options    Personal hx of skin cancer: no  Family hx of skin cancer: no  Social History: Never Smoker    ROS:   No fevers, chills, weight loss  No changing moles    Personal hx of skin cancer: no  Family hx of skin cancer: no    ROS:   Integumentary ROS: negative for rash    Physical Exam:    Vitals:    06/22/16 1457   BP: 120/78   Weight: 98 kg (216 lb)   Height: 1.702 m (5\' 7" )      Pain    06/22/16 1457   PainSc:   0 - No pain      General: Appears well, NAD  Orientation: Alert and oriented x3  Mood/Affect: Normal affect  Skin: All of the following were examined, and were within normal limits, except as noted:   Face  Eyes/Eyelids  Lips  Neck  Extremities (RUE/LUE)  -frontal and posterior scalp:  Several small skin colored plaques with overlying greasy scale.     Assessment/Plan:     Hyperhidrosis (increased sweating) - Under arms, buttocks, groin  -Educated about diagnosis - no cure, but can control with medications  -Recommend deodorant daily - [employer] brand preferred  -Refill and continue aluminum chloride 20% solution (Drysol) at night before bed. Rinse off in morning. Use nightly for a few nights. When sweating has stopped, decrease frequency of use to once a few nights each week.Use as needed  -Side effects of  aluminum chloride - irritation and dryness. If this happens, use it less frequently.  -Once she is done breast feeding (ie. <1 year), we will start oxybutynin XR 5 mg qhs   -Per Dr. Cherly BeachSomers, this is okay to prescribe over the phone if she calls     Seborrheic dermatitis:  Scalp, mild  -Educated about diagnosis and treatment options  -start anti-dandruff shampoo that is over the counter and wash your hair daily   -try to wash your hair every day  -Start mometasone 0.1% lotion twice daily when scalp is itchy; avoid getting this liquid on your face      Barriers to learning: None    Return to Clinic: as needed    Patient seen and discussed with Dr. Virgilio FreesSomers    Jonathan Soh, MD  Dermatology Resident         I saw and evaluated this patient. I agree with the resident's findings and plan of care as documented above. I was present for the key portions of the clinical history, physical exam and treatment plan.    Karleen HampshireKathryn Mida Cory, MD

## 2016-06-25 ENCOUNTER — Ambulatory Visit: Payer: Self-pay | Admitting: Reproductive Endocrinology and Infertility

## 2016-07-06 ENCOUNTER — Telehealth: Payer: Self-pay

## 2016-07-06 NOTE — Telephone Encounter (Signed)
Spoke with patient regarding likely expulsion of IUD, that was placed 05/08/2016 at delivery.  Patient had noted lengthening strings over a week ago, but now states she does not feel them at all.  Patient desires evaluation with ultimate plan for IUD replacement if possible.  Patient given appt on 07/09/2015, urgent schedule (30 min) during same day contraception time slot to evaluate if IUD actually gone, with POSSIBLE replacement only if appropriate.   Patient advised, no unprotected intercourse, until this time.  Patient verbalizes understanding and states ability to come at this scheduled time.

## 2016-07-06 NOTE — Telephone Encounter (Signed)
Patient is calling stating that she believes her IUD has fallen out. Patient states over a week ago she felt the strings being extended out further than usual. When she went to check the strings she felt the IUD pull/tug. At that point the IUD was still in. Patient states she now does not feel the strings at all. Patient is requesting to speak with a nurse.

## 2016-07-08 ENCOUNTER — Ambulatory Visit: Payer: Self-pay | Admitting: Obstetrics and Gynecology

## 2016-07-21 ENCOUNTER — Other Ambulatory Visit: Payer: Self-pay | Admitting: Reproductive Endocrinology and Infertility

## 2016-07-21 DIAGNOSIS — B372 Candidiasis of skin and nail: Secondary | ICD-10-CM

## 2016-07-30 ENCOUNTER — Ambulatory Visit: Payer: Self-pay | Admitting: Obstetrics and Gynecology

## 2016-07-30 ENCOUNTER — Encounter: Payer: Self-pay | Admitting: Obstetrics and Gynecology

## 2016-07-30 VITALS — BP 110/70 | HR 80 | Ht 67.0 in | Wt 211.0 lb

## 2016-07-30 DIAGNOSIS — Z30431 Encounter for routine checking of intrauterine contraceptive device: Secondary | ICD-10-CM

## 2016-07-30 LAB — POCT VAGINAL WET MOUNT
CLUE CELLS,POC: NEGATIVE
KOH FOR FUNGUS: NEGATIVE
TRICHOMONAS, POC: NEGATIVE
YEAST,POC: NEGATIVE

## 2016-07-30 MED ORDER — FLUCONAZOLE 150 MG PO TABS *I*
150.0000 mg | ORAL_TABLET | Freq: Once | ORAL | 0 refills | Status: AC
Start: 2016-07-30 — End: 2016-07-30

## 2016-07-30 NOTE — Progress Notes (Signed)
[facility]    CHIEF COMPLAINT: f/u IUD    HISTORY OF PRESENT ILLNESS  Helen Morrison is a 31 y.o. [id number] female coming in for IUD check.     Patient had vaginal delivery on 11/4 and had an immediate postpartum Liletta IUD placed. Since then, has felt string lengthening, and recently, has not been able to feel the string. Believes the IUD may have expelled. If expelled, patient wanted another one placed.     Denies abdominal pain, vaginal bleeding, dysuria, complaints.    Did have concern for yeast infection and would like to be swabbed.     PMH, PSH, SH, FH, Medications & Allergies reviewed and updated appropriately.    REVIEW OF SYSTEMS   As per HPI    PHYSICAL EXAM  Vitals:    07/30/16 1440   BP: 110/70   Pulse: 80   Weight: 95.7 kg (211 lb)   Height: 1.702 m (5\' 7" )       General - Pleasant, well-appearing female, in NAD  Resp - Normal respiratory effort  Pelvic - Normal-appearing external genitalia, vagina and cervix. Both strings visualized- coming out from a closed cervix. When straightened, the strings were hanging out of vaginal vault. Physiologic vaginal discharge present.  No CMT.  Extremities - No edema, no tenderness.    Wet prep: no pathogens      ASSESSMENT/PLAN  [age] y.o. F presents for IUD check and concern for yeast infection. IUD strings visualized on exam and trimmed. No concern for expulsion. Discussed that after immediate PP IUD placement, strings may lengthen as uterus clamps down. No pathogens visualized on wet prep. Patient symptomatic. Will give diflucan x 1- sent to pharmacy. Patient will fill if symptoms fail to improve.     - diflucan 150mg  x 1 PO- Rx sent to pharmacy  - IUD strings trimmed    Return to clinic PRN    Mosetta Anisheryl S Dayven Linsley, MD  Obstetrics & Gynecology, PGY-1  Pager [phone]

## 2016-08-05 ENCOUNTER — Ambulatory Visit
Admission: AD | Admit: 2016-08-05 | Discharge: 2016-08-05 | Disposition: A | Discharge status: Home / Self Care | Payer: Self-pay

## 2016-08-05 DIAGNOSIS — R231 Pallor: Secondary | ICD-10-CM

## 2016-08-05 NOTE — ED Triage Notes (Signed)
pt with c/o 3 days left lower leg mottling denies any injuy or trauma, denies pain     Triage Note   Gavin PoundElizabeth M Kennedy-Gebo, RN

## 2016-08-05 NOTE — Discharge Instructions (Signed)
this is a violaceous mottling or reticular pattern of the skin of the arms and legs, sometimes with regular unbroken circles. In those with primary RP, this finding is benign and completely reversible with rewarming.      Try warming the skin to see if you have improvement      If you develop pain, swelling, warmth, numbness or tingling- go to an ED

## 2016-08-05 NOTE — UC Provider Note (Signed)
History     Chief Complaint   Patient presents with    Illness     pt with c/o 3 days left lower leg mottling denies any injuy or trauma, denies pain      HPI Comments: Patient is a 31 year old female who comes into urgent care with change in skin color to left lower leg for the past 3 days.  States overall is the rash has improved.  Denies any pain in leg or calf.  No numbness or tingling.  No diffuse erythema or warmth.  Denies any injury.  Denies any new lotions.  No history of DVT.      History provided by:  Patient  Language interpreter used: No        Past Medical History:   Diagnosis Date    Abnormal Pap smear     last negative 04/2012    Anemia     Migraines     Scoliosis     Vitamin D deficiency             Past Surgical History:   Procedure Laterality Date    no fam hx of anes complic      as of 10/16/2012    no hx of anes complic      as of 10/16/2012       Family History   Problem Relation Age of Onset    Hypertension Mother     Diabetes Maternal Aunt     Diabetes Paternal Aunt     Arthritis Paternal Aunt          Social History    reports that she has never smoked. She has never used smokeless tobacco. She reports that she currently engages in sexual activity and has had female partners. She reports using the following methods of birth control/protection: Condom and None. She reports that she does not drink alcohol or use illicit drugs.    Living Situation     Questions Responses    Patient lives with Family    Homeless No    Caregiver for other family member No    External Services None    Employment     Domestic Violence Risk No          Review of Systems   Review of Systems   Skin: Positive for color change.   All other systems reviewed and are negative.      Physical Exam     ED Triage Vitals   BP Heart Rate Heart Rate (via Pulse Ox) Resp Temp Temp src SpO2 (Retired) O2 Device O2 Flow Rate   08/05/16 2034 08/05/16 2034 -- 08/05/16 2034 08/05/16 2034 -- 08/05/16 2034 -- --   117/71 75  18  36.9 C (98.4 F)  98 %        Weight           --                               Physical Exam   Constitutional: She is oriented to person, place, and time. Vital signs are normal. She appears well-developed. No distress.   HENT:   Head: Normocephalic and atraumatic.   Eyes: EOM are normal. Pupils are equal, round, and reactive to light.   Neck: Normal range of motion. Neck supple.   Cardiovascular: Normal rate.    Pulmonary/Chest: Effort normal.   Musculoskeletal:  Left ankle: Normal. She exhibits normal pulse.        Left lower leg: She exhibits no tenderness, no bony tenderness, no swelling, no edema and no deformity.        Legs:       Left foot: Normal.   Neurological: She is alert and oriented to person, place, and time.   Psychiatric: She has a normal mood and affect. Her behavior is normal.   Nursing note and vitals reviewed.       Medical Decision Making        Initial Evaluation:  ED First Provider Contact     Date/Time Event User Comments    08/05/16 2043 ED First Provider Contact Rogelia Boga Initial Face to Face Provider Contact          Patient was seen on: 08/05/2016        Assessment:  30 y.o.female comes to the Urgent Care Center with Discoloration to left lower leg    Differential Diagnosis includes Livedo Reticularis, DVT, vasculitis,                Plan:   Patient has no concerning symptoms such as: pain, swelling, fevers, chills, decreased pulses    Orders Placed This Encounter    ED/UC REFERRAL TO DERMATOLOGY    ED/UC REFERRAL TO PRIMARY CARE       No results found for this or any previous visit (from the past 24 hour(s)).      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    Use over the counter medications as discussed.    Please start the new medications as below:    Current Discharge Medication List          Please follow up with your physician as below:        Thank you Helen Morrison  for coming to [facility] for your health care concerns.    If your condition changes and/or worsens please follow up with her primary doctor and/or return to the urgent care center.    If short of breath, chest pains or any other concerns please report to the emergency room.    In the event of an Emergency dial 911.      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

## 2016-08-06 ENCOUNTER — Telehealth: Payer: Self-pay

## 2016-08-06 NOTE — Telephone Encounter (Signed)
Ms. Helen Morrison is in need of scheduling an urgent FUV appointment with Dr. Cherly BeachSomers [facility]( location). The patient would like to be seen for rash to leg (Please see referral on file). Please call the patient to schedule at [phone].

## 2016-08-09 NOTE — Telephone Encounter (Signed)
Patient has been called and [facility]

## 2016-08-30 ENCOUNTER — Ambulatory Visit: Payer: Self-pay | Admitting: Dermatology
